# Patient Record
Sex: Female | Born: 1935 | Race: Black or African American | Hispanic: No | Smoking: Never smoker
Health system: Southern US, Community
[De-identification: ages and names within clinical notes are randomized; demographics above are authoritative.]

## PROBLEM LIST (undated history)

## (undated) DIAGNOSIS — E079 Disorder of thyroid, unspecified: Secondary | ICD-10-CM

## (undated) DIAGNOSIS — G459 Transient cerebral ischemic attack, unspecified: Secondary | ICD-10-CM

## (undated) DIAGNOSIS — E119 Type 2 diabetes mellitus without complications: Secondary | ICD-10-CM

## (undated) DIAGNOSIS — I639 Cerebral infarction, unspecified: Secondary | ICD-10-CM

## (undated) DIAGNOSIS — I10 Essential (primary) hypertension: Secondary | ICD-10-CM

## (undated) DIAGNOSIS — M1990 Unspecified osteoarthritis, unspecified site: Secondary | ICD-10-CM

## (undated) DIAGNOSIS — I509 Heart failure, unspecified: Secondary | ICD-10-CM

---

## 2017-04-15 ENCOUNTER — Other Ambulatory Visit: Payer: Self-pay

## 2017-04-15 ENCOUNTER — Emergency Department (HOSPITAL_BASED_OUTPATIENT_CLINIC_OR_DEPARTMENT_OTHER)
Admission: EM | Admit: 2017-04-15 | Discharge: 2017-04-15 | Disposition: A | Discharge status: Home / Self Care | Payer: Medicare Other | Attending: Emergency Medicine | Admitting: Emergency Medicine

## 2017-04-15 ENCOUNTER — Encounter (HOSPITAL_BASED_OUTPATIENT_CLINIC_OR_DEPARTMENT_OTHER): Payer: Self-pay | Admitting: *Deleted

## 2017-04-15 ENCOUNTER — Emergency Department (HOSPITAL_BASED_OUTPATIENT_CLINIC_OR_DEPARTMENT_OTHER): Payer: Medicare Other

## 2017-04-15 DIAGNOSIS — Z7982 Long term (current) use of aspirin: Secondary | ICD-10-CM | POA: Insufficient documentation

## 2017-04-15 DIAGNOSIS — Z79899 Other long term (current) drug therapy: Secondary | ICD-10-CM | POA: Diagnosis not present

## 2017-04-15 DIAGNOSIS — Y999 Unspecified external cause status: Secondary | ICD-10-CM | POA: Insufficient documentation

## 2017-04-15 DIAGNOSIS — R3912 Poor urinary stream: Secondary | ICD-10-CM | POA: Diagnosis not present

## 2017-04-15 DIAGNOSIS — Z7902 Long term (current) use of antithrombotics/antiplatelets: Secondary | ICD-10-CM | POA: Diagnosis not present

## 2017-04-15 DIAGNOSIS — Y929 Unspecified place or not applicable: Secondary | ICD-10-CM | POA: Insufficient documentation

## 2017-04-15 DIAGNOSIS — Y939 Activity, unspecified: Secondary | ICD-10-CM | POA: Diagnosis not present

## 2017-04-15 DIAGNOSIS — I509 Heart failure, unspecified: Secondary | ICD-10-CM | POA: Insufficient documentation

## 2017-04-15 DIAGNOSIS — I110 Hypertensive heart disease with heart failure: Secondary | ICD-10-CM | POA: Diagnosis not present

## 2017-04-15 DIAGNOSIS — W19XXXA Unspecified fall, initial encounter: Secondary | ICD-10-CM | POA: Insufficient documentation

## 2017-04-15 DIAGNOSIS — E119 Type 2 diabetes mellitus without complications: Secondary | ICD-10-CM | POA: Diagnosis not present

## 2017-04-15 HISTORY — DX: Essential (primary) hypertension: I10

## 2017-04-15 HISTORY — DX: Type 2 diabetes mellitus without complications: E11.9

## 2017-04-15 HISTORY — DX: Unspecified osteoarthritis, unspecified site: M19.90

## 2017-04-15 HISTORY — DX: Disorder of thyroid, unspecified: E07.9

## 2017-04-15 HISTORY — DX: Heart failure, unspecified: I50.9

## 2017-04-15 LAB — CBC WITH DIFFERENTIAL/PLATELET
Basophils Absolute: 0 10*3/uL (ref 0.0–0.1)
Basophils Relative: 0 %
EOS ABS: 0.1 10*3/uL (ref 0.0–0.7)
EOS PCT: 1 %
HCT: 33.6 % — ABNORMAL LOW (ref 36.0–46.0)
Hemoglobin: 11.4 g/dL — ABNORMAL LOW (ref 12.0–15.0)
LYMPHS ABS: 1.5 10*3/uL (ref 0.7–4.0)
LYMPHS PCT: 35 %
MCH: 26.5 pg (ref 26.0–34.0)
MCHC: 33.9 g/dL (ref 30.0–36.0)
MCV: 78 fL (ref 78.0–100.0)
MONO ABS: 0.6 10*3/uL (ref 0.1–1.0)
MONOS PCT: 15 %
Neutro Abs: 2.1 10*3/uL (ref 1.7–7.7)
Neutrophils Relative %: 49 %
PLATELETS: 297 10*3/uL (ref 150–400)
RBC: 4.31 MIL/uL (ref 3.87–5.11)
RDW: 13.9 % (ref 11.5–15.5)
WBC: 4.3 10*3/uL (ref 4.0–10.5)

## 2017-04-15 LAB — COMPREHENSIVE METABOLIC PANEL
ALT: 146 U/L — ABNORMAL HIGH (ref 14–54)
ANION GAP: 11 (ref 5–15)
AST: 89 U/L — ABNORMAL HIGH (ref 15–41)
Albumin: 3.7 g/dL (ref 3.5–5.0)
Alkaline Phosphatase: 329 U/L — ABNORMAL HIGH (ref 38–126)
BUN: 6 mg/dL (ref 6–20)
CHLORIDE: 103 mmol/L (ref 101–111)
CO2: 23 mmol/L (ref 22–32)
Calcium: 9.3 mg/dL (ref 8.9–10.3)
Creatinine, Ser: 0.77 mg/dL (ref 0.44–1.00)
Glucose, Bld: 201 mg/dL — ABNORMAL HIGH (ref 65–99)
Potassium: 3.6 mmol/L (ref 3.5–5.1)
SODIUM: 137 mmol/L (ref 135–145)
Total Bilirubin: 0.6 mg/dL (ref 0.3–1.2)
Total Protein: 7.6 g/dL (ref 6.5–8.1)

## 2017-04-15 LAB — URINALYSIS, MICROSCOPIC (REFLEX)

## 2017-04-15 LAB — URINALYSIS, ROUTINE W REFLEX MICROSCOPIC
BILIRUBIN URINE: NEGATIVE
Glucose, UA: NEGATIVE mg/dL
Ketones, ur: NEGATIVE mg/dL
Leukocytes, UA: NEGATIVE
NITRITE: NEGATIVE
PROTEIN: NEGATIVE mg/dL
Specific Gravity, Urine: <1.005 — ABNORMAL LOW (ref 1.005–1.030)
pH: 7 (ref 5.0–8.0)

## 2017-04-15 LAB — CK: CK TOTAL: 143 U/L (ref 38–234)

## 2017-04-15 MED ORDER — SODIUM CHLORIDE 0.9 % IV BOLUS
1000.0000 mL | Freq: Once | INTRAVENOUS | Status: AC
  Administered 2017-04-15: 1000 mL via INTRAVENOUS

## 2017-04-15 NOTE — ED Notes (Signed)
Pt on monitor 

## 2017-04-15 NOTE — ED Notes (Signed)
2 unsuccessful IV attempt. 

## 2017-04-15 NOTE — ED Notes (Signed)
Patient is going home with family.  Great grand niece, Marcelino DusterMichelle, signed the discharge info.  Discharge instructions and info given to family.

## 2017-04-15 NOTE — Discharge Instructions (Signed)
Your evaluated in the emergency department for a fall.  We did some lab work to make sure there was no signs of any kidney injury from muscle breakdown.  From that standpoint we see no evidence of that.  Your lab work did show some elevation in your liver enzymes.  We offered you further workup for that and you and your family decided that he would take that up with your primary care doctor.  You should follow-up with your primary care doctor.  Please return if you have any problems.

## 2017-04-15 NOTE — ED Provider Notes (Signed)
[facility] EMERGENCY DEPARTMENT Provider Note   CSN: [id number] Arrival date & time: 04/15/17  1250     History   Chief Complaint Chief Complaint  Patient presents with  . Fall    HPI Debbie Ortiz is a 84 y.o. female.  The history is provided by the patient, a relative and medical records.  Fall  This is a new problem. The current episode started 12 to 24 hours ago. The problem occurs constantly. The problem has been resolved. Pertinent negatives include no chest pain, no abdominal pain, no headaches and no shortness of breath. Nothing aggravates the symptoms. Nothing relieves the symptoms. She has tried nothing for the symptoms. The treatment provided no relief.    Past Medical History:  Diagnosis Date  . Arthritis   . CHF (congestive heart failure) ([place])   . Diabetes mellitus without complication ([place])   . Hypertension   . Thyroid disease     There are no active problems to display for this patient.   History reviewed. No pertinent surgical history.   OB History   None      Home Medications    Prior to Admission medications   Medication Sig Start Date End Date Taking? Authorizing Provider  aspirin 81 MG chewable tablet Chew by mouth daily.   Yes [provider]  atorvastatin (LIPITOR) 20 MG tablet Take 20 mg by mouth daily.   Yes [provider]  clopidogrel (PLAVIX) 75 MG tablet Take 75 mg by mouth daily.   Yes [provider]  diazepam (VALIUM) 2 MG tablet Take 2 mg by mouth every 6 (six) hours as needed for anxiety.   Yes [provider]  furosemide (LASIX) 20 MG tablet Take 20 mg by mouth.   Yes [provider]  HYDROcodone-acetaminophen (NORCO/VICODIN) 5-325 MG tablet Take 1 tablet by mouth every 6 (six) hours as needed for moderate pain.   Yes [provider]  levothyroxine (SYNTHROID, LEVOTHROID) 100 MCG tablet Take 100 mcg by mouth daily before breakfast.   Yes [provider]    losartan (COZAAR) 25 MG tablet Take 25 mg by mouth daily.   Yes [provider]  metoprolol tartrate (LOPRESSOR) 25 MG tablet Take 25 mg by mouth 2 (two) times daily.   Yes [provider]  nitroGLYCERIN (NITROSTAT) 0.4 MG SL tablet Place 0.4 mg under the tongue every 5 (five) minutes as needed for chest pain.   Yes [provider]  polyethylene glycol (MIRALAX / GLYCOLAX) packet Take 17 g by mouth daily.   Yes [provider]    Family History History reviewed. No pertinent family history.  Social History Social History   Tobacco Use  . Smoking status: Never Smoker  . Smokeless tobacco: Never Used  Substance Use Topics  . Alcohol use: Not Currently  . Drug use: Never     Allergies   Codeine and Sulfa antibiotics   Review of Systems Review of Systems  Constitutional: Negative for chills, diaphoresis, fatigue and fever.  HENT: Negative for congestion.   Eyes: Negative for visual disturbance.  Respiratory: Negative for cough, chest tightness and shortness of breath.   Cardiovascular: Negative for chest pain and palpitations.  Gastrointestinal: Negative for abdominal pain, constipation, diarrhea and vomiting.  Genitourinary: Positive for decreased urine volume. Negative for dysuria, flank pain and frequency.  Musculoskeletal: Negative for back pain and neck pain.  Skin: Negative for rash and wound.  Neurological: Negative for light-headedness, numbness and headaches.  Psychiatric/Behavioral: Negative for agitation and confusion.  All other systems reviewed and are negative.    Physical Exam Updated Vital Signs BP (!) 182/90 (BP Location: Right Arm)   Pulse 70   Temp 98.4 F (36.9 C) (Oral)   Resp 18   Ht '5\' 5"'$  (1.651 m)   Wt 77.1 kg (170 lb)   SpO2 98%   BMI 28.29 kg/m   Physical Exam  Constitutional: She is oriented to person, place, and time. She appears well-developed and well-nourished. No distress.  HENT:  Head:  Normocephalic and atraumatic.  Nose: Nose normal.  Mouth/Throat: Oropharynx is clear and moist. No oropharyngeal exudate.  Eyes: Pupils are equal, round, and reactive to light. Conjunctivae and EOM are normal.  Neck: Normal range of motion. Neck supple. No JVD present.  Cardiovascular: Normal rate and intact distal pulses.  No murmur heard. Pulmonary/Chest: Effort normal. No stridor. No respiratory distress. She has no wheezes. She exhibits no tenderness.  Abdominal: Soft. Bowel sounds are normal. She exhibits no distension. There is no tenderness.  Musculoskeletal: Normal range of motion. She exhibits no edema or tenderness.  Neurological: She is alert and oriented to person, place, and time. No sensory deficit. She exhibits normal muscle tone.  Skin: Capillary refill takes less than 2 seconds. No rash noted. She is not diaphoretic. No erythema.  Psychiatric: She has a normal mood and affect.  Nursing note and vitals reviewed.    ED Treatments / Results  Labs (all labs ordered are listed, but only abnormal results are displayed) Labs Reviewed  URINALYSIS, ROUTINE W REFLEX MICROSCOPIC - Abnormal; Notable for the following components:      Result Value   Specific Gravity, Urine <1.005 (*)    Hgb urine dipstick TRACE (*)    All other components within normal limits  URINALYSIS, MICROSCOPIC (REFLEX) - Abnormal; Notable for the following components:   Bacteria, UA FEW (*)    Squamous Epithelial / LPF 0-5 (*)    All other components within normal limits  URINE CULTURE  CBC WITH DIFFERENTIAL/PLATELET  COMPREHENSIVE METABOLIC PANEL  CK    EKG EKG Interpretation  Date/Time:  Friday April 15 2017 14:02:12 EDT Ventricular Rate:  65 PR Interval:    QRS Duration: 86 QT Interval:  381 QTC Calculation: 397 R Axis:   -5 Text Interpretation:  Sinus rhythm Borderline repolarization abnormality Baseline wander in lead(s) I III aVR aVL No prior ECG for comparison.  T wave inversion in  leads 3,AVF, V5-V6.  No STEMI Confirmed by Antony Blackbird [phone]) on 04/15/2017 2:10:10 PM   Radiology Dg Chest 2 View  Result Date: 04/15/2017 CLINICAL DATA:  Status post fall. EXAM: CHEST - 2 VIEW COMPARISON:  None. FINDINGS: There is no focal consolidation. There is no pleural effusion or pneumothorax. The heart and mediastinal contours are unremarkable. The osseous structures are unremarkable. IMPRESSION: No active cardiopulmonary disease. Electronically Signed   By: Kathreen Devoid   On: 04/15/2017 13:59    Procedures Procedures (including critical care time)  Medications Ordered in ED Medications  sodium chloride 0.9 % bolus 1,000 mL (1,000 mLs Intravenous New Bag/Given 04/15/17 1618)     Initial Impression / Assessment and Plan / ED Course  I have reviewed the triage vital signs and the nursing notes.  Pertinent labs & imaging results that were available during my care of the patient were reviewed by me and considered in my medical decision making (see chart for details).  Clinical Course as of Apr 16 971  Fri Apr 15, 2017  1700 Patient's lab work is unremarkable for any evidence of renal dysfunction or elevated CPK.  I do not see any old labs on her and her AST and ALT and alk phos are elevated with a normal T bili.  She does not seem to have any complaints to this of asked her to follow-up with her primary care doctor regarding this.   [MB]    Clinical Course User Index [MB] Hayden Rasmussen, MD    Meriem Lemieux is a 84 y.o. female with a past medical history significant for hypertension, diabetes, CHF, and thyroid disease who presents with decreased urination for the last few days and then a fall with prolonged period of downtime overnight.  Patient reports that for the last few days she has had decreased urination.  She denies dysuria but reports it is darker.  She denies abdominal pain, chest pain or palpitations.  She denies recent fevers or chills.  She denies nausea,  vomiting, headache, or neck pain.  She reports that last night she had an episode where she had chronic right knee pains that caused her to slide to the ground.  She reports that she did not fall or hit her head and is not having any traumatic injuries.  She denies any pain in any location at this time but reports that due to her knee injury she cannot get up on her own.  She reports that she laid on the ground for approximately 10 hours prior to help coming this morning.  She reports that she was able to pull a blanket and pillow to the ground.  Patient was sent for evaluation to rule out rhabdo in the setting of prolonged downtime and decreased urination.  On exam, lungs are clear.  Chest is nontender.  Back nontender.  No bruises or traumatic injury evidence is seen.  Patient had no focal neurologic deficits.  Patient alert and oriented.  Patient's abdomen is nontender.  No CVA tenderness.  Patient has tenderness in her right knee which she reports is chronic.  Normal pulses in all extremity's.  Normal sensation throughout.  Patient appears well.    Due to the prolonged downtime and patient will screen laboratory testing including CK.  Patient will have urinalysis and urine culture given the urine changes over the last few days.  Patient will chest x-ray as well given the downtime and report of fall.    If workup is reassuring and patient continues to have reassuring vital signs, patient is likely stable for discharge home however, if patient has any abnormality seen, will have to consider rehydration or admission.     Urinalysis shows no evidence of infection with negative nitrites and leukocytes.  Ultrasound IV was placed by me for IV access.  Chest x-ray shows no pneumonia.    Care transferred to Dr. Melina Copa.  If labs are reassuring, patient will likely be stable for discharge home.  Patient was given fluids to rehydrate her during workup.   Final Clinical Impressions(s) / ED Diagnoses   Final  diagnoses:  Fall, initial encounter    Clinical Impression: 1. Fall, initial encounter     Disposition: Care transferred to Dr. Talmage Coin, Gwenyth Allegra, MD 04/15/17 [phone]

## 2017-04-15 NOTE — ED Triage Notes (Signed)
Pt just moved to [facility].Pt got upon the middle of the night, tripped on her walker and slid herself to the floor. Did not hit head.  Home health found her this morning after being on the floor for 10 hours. No abnormalities. Urine has been noted to be dark in color. [facility] VS are as follows: BP:168/86 HR: 80 Resp: 18 CBG: 220

## 2017-04-15 NOTE — ED Provider Notes (Signed)
Sign-out note from Dr. Sherry Ruffing.  Patient had a fall last night and stayed on the floor until morning.  She is here for an evaluation of that.  Plan is to evaluate the labs for any signs of rhabdo with worsening creatinine or elevated CPK.  If these are normal she can be discharged home. Clinical Course as of Apr 16 1744  Fri Apr 15, 2017  1700 Patient's lab work is unremarkable for any evidence of renal dysfunction or elevated CPK.  I do not see any old labs on her and her AST and ALT and alk phos are elevated with a normal T bili.  She does not seem to have any complaints to this of asked her to follow-up with her primary care doctor regarding this.   [MB]    Clinical Course User Index [MB] Hayden Rasmussen, MD      Hayden Rasmussen, MD 04/16/17 [phone]

## 2017-04-16 LAB — URINE CULTURE: Culture: NO GROWTH

## 2017-05-13 ENCOUNTER — Emergency Department (HOSPITAL_COMMUNITY)
Admission: EM | Admit: 2017-05-13 | Discharge: 2017-05-13 | Disposition: A | Discharge status: Home / Self Care | Payer: Medicare Other | Attending: Emergency Medicine | Admitting: Emergency Medicine

## 2017-05-13 ENCOUNTER — Other Ambulatory Visit: Payer: Self-pay

## 2017-05-13 ENCOUNTER — Encounter (HOSPITAL_COMMUNITY): Payer: Self-pay

## 2017-05-13 ENCOUNTER — Emergency Department (HOSPITAL_COMMUNITY): Payer: Medicare Other

## 2017-05-13 DIAGNOSIS — Z7902 Long term (current) use of antithrombotics/antiplatelets: Secondary | ICD-10-CM | POA: Diagnosis not present

## 2017-05-13 DIAGNOSIS — I509 Heart failure, unspecified: Secondary | ICD-10-CM | POA: Diagnosis not present

## 2017-05-13 DIAGNOSIS — M79641 Pain in right hand: Secondary | ICD-10-CM | POA: Diagnosis not present

## 2017-05-13 DIAGNOSIS — Z7982 Long term (current) use of aspirin: Secondary | ICD-10-CM | POA: Diagnosis not present

## 2017-05-13 DIAGNOSIS — Y9389 Activity, other specified: Secondary | ICD-10-CM | POA: Insufficient documentation

## 2017-05-13 DIAGNOSIS — I110 Hypertensive heart disease with heart failure: Secondary | ICD-10-CM | POA: Insufficient documentation

## 2017-05-13 DIAGNOSIS — Y999 Unspecified external cause status: Secondary | ICD-10-CM | POA: Diagnosis not present

## 2017-05-13 DIAGNOSIS — R5383 Other fatigue: Secondary | ICD-10-CM | POA: Diagnosis not present

## 2017-05-13 DIAGNOSIS — W010XXA Fall on same level from slipping, tripping and stumbling without subsequent striking against object, initial encounter: Secondary | ICD-10-CM | POA: Diagnosis not present

## 2017-05-13 DIAGNOSIS — Y92009 Unspecified place in unspecified non-institutional (private) residence as the place of occurrence of the external cause: Secondary | ICD-10-CM | POA: Insufficient documentation

## 2017-05-13 DIAGNOSIS — E119 Type 2 diabetes mellitus without complications: Secondary | ICD-10-CM | POA: Diagnosis not present

## 2017-05-13 DIAGNOSIS — W19XXXA Unspecified fall, initial encounter: Secondary | ICD-10-CM

## 2017-05-13 DIAGNOSIS — M542 Cervicalgia: Secondary | ICD-10-CM | POA: Insufficient documentation

## 2017-05-13 DIAGNOSIS — Z79899 Other long term (current) drug therapy: Secondary | ICD-10-CM | POA: Diagnosis not present

## 2017-05-13 DIAGNOSIS — R51 Headache: Secondary | ICD-10-CM | POA: Insufficient documentation

## 2017-05-13 LAB — CBC WITH DIFFERENTIAL/PLATELET
BASOS ABS: 0 10*3/uL (ref 0.0–0.1)
Basophils Relative: 0 %
Eosinophils Absolute: 0 10*3/uL (ref 0.0–0.7)
Eosinophils Relative: 0 %
HCT: 33.8 % — ABNORMAL LOW (ref 36.0–46.0)
HEMOGLOBIN: 11.6 g/dL — AB (ref 12.0–15.0)
LYMPHS PCT: 23 %
Lymphs Abs: 1.7 10*3/uL (ref 0.7–4.0)
MCH: 26.6 pg (ref 26.0–34.0)
MCHC: 34.3 g/dL (ref 30.0–36.0)
MCV: 77.5 fL — AB (ref 78.0–100.0)
Monocytes Absolute: 1.1 10*3/uL — ABNORMAL HIGH (ref 0.1–1.0)
Monocytes Relative: 16 %
NEUTROS PCT: 61 %
Neutro Abs: 4.4 10*3/uL (ref 1.7–7.7)
Platelets: 384 10*3/uL (ref 150–400)
RBC: 4.36 MIL/uL (ref 3.87–5.11)
RDW: 14.2 % (ref 11.5–15.5)
WBC: 7.3 10*3/uL (ref 4.0–10.5)

## 2017-05-13 LAB — COMPREHENSIVE METABOLIC PANEL
ALK PHOS: 306 U/L — AB (ref 38–126)
ALT: 25 U/L (ref 14–54)
AST: 37 U/L (ref 15–41)
Albumin: 3.4 g/dL — ABNORMAL LOW (ref 3.5–5.0)
Anion gap: 16 — ABNORMAL HIGH (ref 5–15)
BUN: 16 mg/dL (ref 6–20)
CALCIUM: 9.7 mg/dL (ref 8.9–10.3)
CO2: 19 mmol/L — ABNORMAL LOW (ref 22–32)
CREATININE: 1 mg/dL (ref 0.44–1.00)
Chloride: 93 mmol/L — ABNORMAL LOW (ref 101–111)
GFR calc non Af Amer: 47 mL/min — ABNORMAL LOW (ref >60)
GFR, EST AFRICAN AMERICAN: 55 mL/min — AB (ref >60)
Glucose, Bld: 59 mg/dL — ABNORMAL LOW (ref 65–99)
Potassium: 4.5 mmol/L (ref 3.5–5.1)
SODIUM: 128 mmol/L — AB (ref 135–145)
Total Bilirubin: 0.6 mg/dL (ref 0.3–1.2)
Total Protein: 8 g/dL (ref 6.5–8.1)

## 2017-05-13 LAB — URINALYSIS, ROUTINE W REFLEX MICROSCOPIC
BILIRUBIN URINE: NEGATIVE
GLUCOSE, UA: NEGATIVE mg/dL
HGB URINE DIPSTICK: NEGATIVE
KETONES UR: NEGATIVE mg/dL
Leukocytes, UA: NEGATIVE
NITRITE: NEGATIVE
Protein, ur: NEGATIVE mg/dL
SPECIFIC GRAVITY, URINE: 1.004 — AB (ref 1.005–1.030)
pH: 7 (ref 5.0–8.0)

## 2017-05-13 LAB — CBG MONITORING, ED: Glucose-Capillary: 66 mg/dL (ref 65–99)

## 2017-05-13 NOTE — ED Triage Notes (Signed)
[facility] r/t fall when attempted to get out of bed. Pt called EMS, denies LOC, denies hitting head states her right hand and right eyelid are sore. 146/64 BP, 60 HR, 100% RA, 74 CBG, 16 RR. EMS reports pt is on blood thinners. Denies neck and back pain.

## 2017-05-13 NOTE — ED Notes (Signed)
Pt provided sandwich, applesauce and beverage while waiting for [facility]

## 2017-05-13 NOTE — Discharge Instructions (Signed)
Your imaging today did not show evidence of injuries from her fall.  We did not find evidence of acute infection causing your fatigue.  Please stay hydrated and follow-up with your primary doctor in the next few days.  If any symptoms change or worsen, please return to the nearest emergency department.

## 2017-05-13 NOTE — ED Provider Notes (Signed)
[facility] EMERGENCY DEPARTMENT Provider Note   CSN: [id number] Arrival date & time: 05/13/17  1501     History   Chief Complaint Chief Complaint  Patient presents with  . Fall    HPI Debbie Ortiz is a 84 y.o. female.  The history is provided by the patient, a relative and medical records.  Fall  This is a recurrent problem. The current episode started 3 to 5 hours ago. The problem occurs constantly. The problem has been resolved. Associated symptoms include headaches. Pertinent negatives include no chest pain and no shortness of breath. Nothing aggravates the symptoms. Nothing relieves the symptoms. She has tried nothing for the symptoms. The treatment provided no relief.    Past Medical History:  Diagnosis Date  . Arthritis   . CHF (congestive heart failure) (HCC)   . Diabetes mellitus without complication (HCC)   . Hypertension   . Thyroid disease     There are no active problems to display for this patient.   History reviewed. No pertinent surgical history.   OB History   None      Home Medications    Prior to Admission medications   Medication Sig Start Date End Date Taking? Authorizing Provider  aspirin 81 MG chewable tablet Chew by mouth daily.    [provider]  atorvastatin (LIPITOR) 20 MG tablet Take 20 mg by mouth daily.    [provider]  clopidogrel (PLAVIX) 75 MG tablet Take 75 mg by mouth daily.    [provider]  diazepam (VALIUM) 2 MG tablet Take 2 mg by mouth every 6 (six) hours as needed for anxiety.    [provider]  furosemide (LASIX) 20 MG tablet Take 20 mg by mouth.    [provider]  HYDROcodone-acetaminophen (NORCO/VICODIN) 5-325 MG tablet Take 1 tablet by mouth every 6 (six) hours as needed for moderate pain.    [provider]  levothyroxine (SYNTHROID, LEVOTHROID) 100 MCG tablet Take 100 mcg by mouth daily before breakfast.    [provider]    losartan (COZAAR) 25 MG tablet Take 25 mg by mouth daily.    [provider]  metoprolol tartrate (LOPRESSOR) 25 MG tablet Take 25 mg by mouth 2 (two) times daily.    [provider]  nitroGLYCERIN (NITROSTAT) 0.4 MG SL tablet Place 0.4 mg under the tongue every 5 (five) minutes as needed for chest pain.    [provider]  polyethylene glycol (MIRALAX / GLYCOLAX) packet Take 17 g by mouth daily.    [provider]    Family History No family history on file.  Social History Social History   Tobacco Use  . Smoking status: Never Smoker  . Smokeless tobacco: Never Used  Substance Use Topics  . Alcohol use: Not Currently  . Drug use: Never     Allergies   Codeine and Sulfa antibiotics   Review of Systems Review of Systems  Constitutional: Positive for fatigue. Negative for chills, diaphoresis and fever.  HENT: Negative for congestion and rhinorrhea.   Eyes: Negative for visual disturbance.  Respiratory: Negative for cough, chest tightness, shortness of breath and wheezing.   Cardiovascular: Negative for chest pain, palpitations and leg swelling.  Gastrointestinal: Negative for constipation, diarrhea, nausea and vomiting.  Genitourinary: Negative for dysuria.  Musculoskeletal: Positive for neck pain. Negative for back pain and neck stiffness.  Skin: Negative for rash and wound.  Neurological: Positive for headaches. Negative for dizziness and  light-headedness.  Psychiatric/Behavioral: Negative for agitation and confusion.  All other systems reviewed and are negative.    Physical Exam Updated Vital Signs BP (!) 155/65 (BP Location: Right Arm)   Pulse (!) 58   Temp 97.7 F (36.5 C) (Oral)   Resp 16   Ht 5\' 5"  (1.651 m)   Wt 77.1 kg (170 lb)   SpO2 98%   BMI 28.29 kg/m   Physical Exam  Constitutional: She appears well-developed and well-nourished. No distress.  HENT:  Head: Normocephalic and atraumatic. Head is without  laceration.    Mouth/Throat: Oropharynx is clear and moist. No oropharyngeal exudate.  Eyes: Conjunctivae are normal.  Neck: Neck supple.  Cardiovascular: Normal rate and regular rhythm.  No murmur heard. Pulmonary/Chest: Effort normal and breath sounds normal. No respiratory distress. She has no wheezes. She exhibits no tenderness.  Abdominal: Soft. There is no tenderness.  Musculoskeletal: She exhibits no edema.       Right hand: She exhibits tenderness. She exhibits normal capillary refill, no deformity, no laceration and no swelling. Normal sensation noted. Normal strength noted.       Hands: Neurological: She is alert.  Skin: Skin is warm and dry. No rash noted. She is not diaphoretic. No erythema.  Psychiatric: She has a normal mood and affect.  Nursing note and vitals reviewed.    ED Treatments / Results  Labs (all labs ordered are listed, but only abnormal results are displayed) Labs Reviewed  CBC WITH DIFFERENTIAL/PLATELET - Abnormal; Notable for the following components:      Result Value   Hemoglobin 11.6 (*)    HCT 33.8 (*)    MCV 77.5 (*)    Monocytes Absolute 1.1 (*)    All other components within normal limits  COMPREHENSIVE METABOLIC PANEL - Abnormal; Notable for the following components:   Sodium 128 (*)    Chloride 93 (*)    CO2 19 (*)    Glucose, Bld 59 (*)    Albumin 3.4 (*)    Alkaline Phosphatase 306 (*)    GFR calc non Af Amer 47 (*)    GFR calc Af Amer 55 (*)    Anion gap 16 (*)    All other components within normal limits  URINALYSIS, ROUTINE W REFLEX MICROSCOPIC - Abnormal; Notable for the following components:   Color, Urine STRAW (*)    Specific Gravity, Urine 1.004 (*)    All other components within normal limits  URINE CULTURE  CBG MONITORING, ED    EKG None  Radiology Dg Chest 2 View  Result Date: 05/13/2017 CLINICAL DATA:  Larey Seat trying to get out of bed, fatigue, weakness EXAM: CHEST - 2 VIEW COMPARISON:  Chest x-ray of 04/15/2017  FINDINGS: No active infiltrate or effusion is seen. Prominent soft tissue in the right paratracheal region probably represents ectatic great vessels but attention to this area on followup chest x-ray is recommended. Cardiomegaly is stable. No acute bony abnormality is seen. There is mild degenerative change in both shoulders. IMPRESSION: 1. No active lung disease. 2. Prominent soft tissue in the right superior paratracheal region most likely ectatic great vessels but recommend attention to this area on followup chest x-ray. 3. Stable cardiomegaly. Electronically Signed   By: Dwyane Dee M.D.   On: 05/13/2017 16:15   Dg Wrist Complete Right  Result Date: 05/13/2017 CLINICAL DATA:  Fall. EXAM: RIGHT HAND - COMPLETE 3+ VIEW; RIGHT WRIST - COMPLETE 3+ VIEW COMPARISON:  None. FINDINGS: No acute fracture or  dislocation. 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 Diffuse osteopenia. Chondrocalcinosis of the TFCC. Soft tissues are unremarkable. IMPRESSION: 1.  No acute osseous abnormality. Electronically Signed   By: Obie DredgeWilliam T Derry M.D.   On: 05/13/2017 16:18   Ct Head Wo Contrast  Result Date: 05/13/2017 CLINICAL DATA:  Larey SeatFell getting out of bed today. EXAM: CT HEAD WITHOUT CONTRAST CT CERVICAL SPINE WITHOUT CONTRAST TECHNIQUE: Multidetector CT imaging of the head and cervical spine was performed following the standard protocol without intravenous contrast. Multiplanar CT image reconstructions of the cervical spine were also generated. COMPARISON:  None. FINDINGS: CT HEAD FINDINGS Brain: No evidence of acute infarction, hemorrhage, hydrocephalus, extra-axial collection or mass lesion/mass effect. Mild periventricular white matter hypoattenuation is noted consistent with chronic microvascular ischemic change. Vascular: No hyperdense vessel or unexpected calcification. Skull: Normal. Negative for fracture or focal lesion. Sinuses/Orbits: Globes and orbits are unremarkable. Sinuses  and mastoid air cells are clear. Other: None. CT CERVICAL SPINE FINDINGS Alignment: Normal. Skull base and vertebrae: No acute fracture. No primary bone lesion or focal pathologic process. Soft tissues and spinal canal: No prevertebral fluid or swelling. No visible canal hematoma. Disc levels: There are degenerative changes throughout the cervical spine. There is marked loss of disc height with endplate sclerosis and osteophytes at C3-C4 moderate loss of disc height and endplate spurring at C5-C6. Facet degenerative changes are noted bilaterally greatest on the left at C4-C5. No convincing disc herniation. Upper chest: No acute findings.  Minor scarring at the lung apices. Other: None. IMPRESSION: HEAD CT 1. No acute intracranial abnormalities. 2. Mild chronic microvascular ischemic change. CERVICAL CT 1. No fracture or acute finding. Electronically Signed   By: Amie Portlandavid  Ormond M.D.   On: 05/13/2017 15:48   Ct Cervical Spine Wo Contrast  Result Date: 05/13/2017 CLINICAL DATA:  Larey SeatFell getting out of bed today. EXAM: CT HEAD WITHOUT CONTRAST CT CERVICAL SPINE WITHOUT CONTRAST TECHNIQUE: Multidetector CT imaging of the head and cervical spine was performed following the standard protocol without intravenous contrast. Multiplanar CT image reconstructions of the cervical spine were also generated. COMPARISON:  None. FINDINGS: CT HEAD FINDINGS Brain: No evidence of acute infarction, hemorrhage, hydrocephalus, extra-axial collection or mass lesion/mass effect. Mild periventricular white matter hypoattenuation is noted consistent with chronic microvascular ischemic change. Vascular: No hyperdense vessel or unexpected calcification. Skull: Normal. Negative for fracture or focal lesion. Sinuses/Orbits: Globes and orbits are unremarkable. Sinuses and mastoid air cells are clear. Other: None. CT CERVICAL SPINE FINDINGS Alignment: Normal. Skull base and vertebrae: No acute fracture. No primary bone lesion or focal pathologic  process. Soft tissues and spinal canal: No prevertebral fluid or swelling. No visible canal hematoma. Disc levels: There are degenerative changes throughout the cervical spine. There is marked loss of disc height with endplate sclerosis and osteophytes at C3-C4 moderate loss of disc height and endplate spurring at C5-C6. Facet degenerative changes are noted bilaterally greatest on the left at C4-C5. No convincing disc herniation. Upper chest: No acute findings.  Minor scarring at the lung apices. Other: None. IMPRESSION: HEAD CT 1. No acute intracranial abnormalities. 2. Mild chronic microvascular ischemic change. CERVICAL CT 1. No fracture or acute finding. Electronically Signed   By: Amie Portlandavid  Ormond M.D.   On: 05/13/2017 15:48   Dg Hand Complete Right  Result Date: 05/13/2017 CLINICAL DATA:  Fall. EXAM: RIGHT HAND - COMPLETE 3+ VIEW; RIGHT WRIST - COMPLETE 3+ VIEW COMPARISON:  None. FINDINGS: No acute fracture or dislocation. Mild osteoarthritis of  the first [facility] joint and scaphotrapeziotrapezoid joint. Diffuse osteopenia. Chondrocalcinosis of the TFCC. Soft tissues are unremarkable. IMPRESSION: 1.  No acute osseous abnormality. Electronically Signed   By: Obie Dredge M.D.   On: 05/13/2017 16:18    Procedures Procedures (including critical care time)  Medications Ordered in ED Medications - No data to display   Initial Impression / Assessment and Plan / ED Course  I have reviewed the triage vital signs and the nursing notes.  Pertinent labs & imaging results that were available during my care of the patient were reviewed by me and considered in my medical decision making (see chart for details).     Debbie Ortiz is a 84 y.o. female with a past medical history significant for hypertension, diabetes, thyroid disease, CHF, arthritis, and multiple falls who presents with a fall.  Patient reports that she was trying to get out of bed and walk to the bathroom when she tripped.  She reports having  pain in her right hand, her right head, and her neck.  She does report that she has had some fatigue recently but denies any urinary symptoms, GI symptoms, chest pain, shortness breath, or cough.  She denied loss of consciousness.  She reports her headache is mild to moderate on the right side of her head and upper face.  She denies any vision changes.    On exam, patient had normal sensation and strength in all extremities.  Patient had mild tenderness on her right upper face.  Patient had normal extractor movements.  Pupils are symmetric and reactive bilaterally.  Mild cloudiness in the left cornea.  Patient's oropharyngeal exam unremarkable.  Patient had subtle tenderness in her bilateral paraspinal neck.  No significant back tenderness lungs clear.  Chest nontender.  Patient has chronic right knee pain which she reports is unchanged and it was nontender on my exam.  Tenderness in the right dorsal hand but no tenderness in the right snuffbox.  Patient will have imaging of her hand as well as head and neck.  Due to the reported fatigue, patient will have work-up to look for occult infection or letter abnormality may contribute to her fall.  Anticipate reassessment after imaging and work-up.  7:30 PM Diagnostic testing did not show evidence of acute fractures.  We did not find evidence of new bacterial infection.  Patient did have some electrolyte abnormality with hyponatremia.  Patient encouraged to stay hydrated at home.  Patient was feeling much better and her cervical towel was removed.  Patient will follow up with PCP in several days.  Patient and family had no other questions or concerns and patient was discharged in good condition.    Final Clinical Impressions(s) / ED Diagnoses   Final diagnoses:  Fall, initial encounter    ED Discharge Orders    None      Clinical Impression: 1. Fall, initial encounter     Disposition: Discharge  Condition: Good  I have discussed the  results, Dx and Tx plan with the pt(& family if present). He/she/they expressed understanding and agree(s) with the plan. Discharge instructions discussed at great length. Strict return precautions discussed and pt &/or family have verbalized understanding of the instructions. No further questions at time of discharge.    New Prescriptions   No medications on file    Follow Up: [facility] EMERGENCY DEPARTMENT [address] [id number] mc [place] [address] [phone]    your primary doctor  Tegeler, Canary Brim, MD 05/14/17 [phone]

## 2017-05-13 NOTE — ED Notes (Signed)
[facility] contacted for tx back home 

## 2017-05-13 NOTE — ED Notes (Signed)
ED Provider at bedside. 

## 2017-05-13 NOTE — ED Notes (Signed)
ED provider at bedside.

## 2017-05-13 NOTE — ED Notes (Signed)
E-signature not available. Patient/caregiver verbalizes understanding of discharge instructions.

## 2017-05-13 NOTE — ED Notes (Signed)
Patient transported to CT 

## 2017-05-14 LAB — URINE CULTURE: Culture: <10000 — AB

## 2017-05-15 ENCOUNTER — Other Ambulatory Visit: Payer: Self-pay

## 2017-05-15 ENCOUNTER — Observation Stay (HOSPITAL_COMMUNITY): Payer: Medicare Other

## 2017-05-15 ENCOUNTER — Encounter (HOSPITAL_BASED_OUTPATIENT_CLINIC_OR_DEPARTMENT_OTHER): Payer: Self-pay | Admitting: *Deleted

## 2017-05-15 ENCOUNTER — Emergency Department (HOSPITAL_BASED_OUTPATIENT_CLINIC_OR_DEPARTMENT_OTHER): Payer: Medicare Other

## 2017-05-15 ENCOUNTER — Inpatient Hospital Stay (HOSPITAL_BASED_OUTPATIENT_CLINIC_OR_DEPARTMENT_OTHER)
Admission: EM | Admit: 2017-05-15 | Discharge: 2017-05-20 | DRG: 069 | Disposition: A | Discharge status: Skilled Nursing Facility | Payer: Medicare Other | Attending: Internal Medicine | Admitting: Internal Medicine

## 2017-05-15 DIAGNOSIS — I10 Essential (primary) hypertension: Secondary | ICD-10-CM | POA: Diagnosis present

## 2017-05-15 DIAGNOSIS — E119 Type 2 diabetes mellitus without complications: Secondary | ICD-10-CM

## 2017-05-15 DIAGNOSIS — G8194 Hemiplegia, unspecified affecting left nondominant side: Secondary | ICD-10-CM | POA: Diagnosis present

## 2017-05-15 DIAGNOSIS — I471 Supraventricular tachycardia: Secondary | ICD-10-CM | POA: Diagnosis not present

## 2017-05-15 DIAGNOSIS — R7989 Other specified abnormal findings of blood chemistry: Secondary | ICD-10-CM

## 2017-05-15 DIAGNOSIS — F0390 Unspecified dementia without behavioral disturbance: Secondary | ICD-10-CM | POA: Diagnosis present

## 2017-05-15 DIAGNOSIS — Z7902 Long term (current) use of antithrombotics/antiplatelets: Secondary | ICD-10-CM

## 2017-05-15 DIAGNOSIS — G459 Transient cerebral ischemic attack, unspecified: Secondary | ICD-10-CM | POA: Diagnosis present

## 2017-05-15 DIAGNOSIS — R29707 NIHSS score 7: Secondary | ICD-10-CM | POA: Diagnosis present

## 2017-05-15 DIAGNOSIS — E871 Hypo-osmolality and hyponatremia: Secondary | ICD-10-CM | POA: Diagnosis present

## 2017-05-15 DIAGNOSIS — Z6829 Body mass index (BMI) 29.0-29.9, adult: Secondary | ICD-10-CM

## 2017-05-15 DIAGNOSIS — Z882 Allergy status to sulfonamides status: Secondary | ICD-10-CM

## 2017-05-15 DIAGNOSIS — I110 Hypertensive heart disease with heart failure: Secondary | ICD-10-CM | POA: Diagnosis not present

## 2017-05-15 DIAGNOSIS — E11649 Type 2 diabetes mellitus with hypoglycemia without coma: Secondary | ICD-10-CM | POA: Diagnosis not present

## 2017-05-15 DIAGNOSIS — E039 Hypothyroidism, unspecified: Secondary | ICD-10-CM | POA: Diagnosis present

## 2017-05-15 DIAGNOSIS — R402142 Coma scale, eyes open, spontaneous, at arrival to emergency department: Secondary | ICD-10-CM | POA: Diagnosis present

## 2017-05-15 DIAGNOSIS — Z7982 Long term (current) use of aspirin: Secondary | ICD-10-CM

## 2017-05-15 DIAGNOSIS — I5030 Unspecified diastolic (congestive) heart failure: Secondary | ICD-10-CM | POA: Diagnosis not present

## 2017-05-15 DIAGNOSIS — Z66 Do not resuscitate: Secondary | ICD-10-CM

## 2017-05-15 DIAGNOSIS — Z79890 Hormone replacement therapy (postmenopausal): Secondary | ICD-10-CM | POA: Diagnosis not present

## 2017-05-15 DIAGNOSIS — Z8679 Personal history of other diseases of the circulatory system: Secondary | ICD-10-CM

## 2017-05-15 DIAGNOSIS — R4189 Other symptoms and signs involving cognitive functions and awareness: Secondary | ICD-10-CM | POA: Diagnosis not present

## 2017-05-15 DIAGNOSIS — R4781 Slurred speech: Secondary | ICD-10-CM | POA: Diagnosis not present

## 2017-05-15 DIAGNOSIS — R627 Adult failure to thrive: Secondary | ICD-10-CM | POA: Diagnosis not present

## 2017-05-15 DIAGNOSIS — Z885 Allergy status to narcotic agent status: Secondary | ICD-10-CM | POA: Diagnosis not present

## 2017-05-15 DIAGNOSIS — M1990 Unspecified osteoarthritis, unspecified site: Secondary | ICD-10-CM | POA: Diagnosis not present

## 2017-05-15 DIAGNOSIS — R402362 Coma scale, best motor response, obeys commands, at arrival to emergency department: Secondary | ICD-10-CM | POA: Diagnosis present

## 2017-05-15 DIAGNOSIS — R402252 Coma scale, best verbal response, oriented, at arrival to emergency department: Secondary | ICD-10-CM | POA: Diagnosis present

## 2017-05-15 DIAGNOSIS — Z87898 Personal history of other specified conditions: Secondary | ICD-10-CM

## 2017-05-15 DIAGNOSIS — R531 Weakness: Secondary | ICD-10-CM

## 2017-05-15 DIAGNOSIS — R778 Other specified abnormalities of plasma proteins: Secondary | ICD-10-CM | POA: Diagnosis present

## 2017-05-15 DIAGNOSIS — Z515 Encounter for palliative care: Secondary | ICD-10-CM

## 2017-05-15 LAB — CBC WITH DIFFERENTIAL/PLATELET
BASOS ABS: 0 10*3/uL (ref 0.0–0.1)
BASOS PCT: 0 %
EOS ABS: 0.1 10*3/uL (ref 0.0–0.7)
Eosinophils Relative: 1 %
HCT: 34.4 % — ABNORMAL LOW (ref 36.0–46.0)
Hemoglobin: 11.9 g/dL — ABNORMAL LOW (ref 12.0–15.0)
Lymphocytes Relative: 35 %
Lymphs Abs: 2 10*3/uL (ref 0.7–4.0)
MCH: 26.6 pg (ref 26.0–34.0)
MCHC: 34.6 g/dL (ref 30.0–36.0)
MCV: 77 fL — ABNORMAL LOW (ref 78.0–100.0)
Monocytes Absolute: 1.1 10*3/uL — ABNORMAL HIGH (ref 0.1–1.0)
Monocytes Relative: 20 %
Neutro Abs: 2.5 10*3/uL (ref 1.7–7.7)
Neutrophils Relative %: 44 %
Platelets: 392 10*3/uL (ref 150–400)
RBC: 4.47 MIL/uL (ref 3.87–5.11)
RDW: 14.3 % (ref 11.5–15.5)
WBC: 5.7 10*3/uL (ref 4.0–10.5)

## 2017-05-15 LAB — RAPID URINE DRUG SCREEN, HOSP PERFORMED
Amphetamines: NOT DETECTED
BARBITURATES: NOT DETECTED
Benzodiazepines: POSITIVE — AB
COCAINE: NOT DETECTED
OPIATES: NOT DETECTED
Tetrahydrocannabinol: NOT DETECTED

## 2017-05-15 LAB — TROPONIN I: Troponin I: 0.06 ng/mL (ref <0.03)

## 2017-05-15 LAB — COMPREHENSIVE METABOLIC PANEL
ALK PHOS: 292 U/L — AB (ref 38–126)
ALT: 26 U/L (ref 14–54)
AST: 38 U/L (ref 15–41)
Albumin: 3.7 g/dL (ref 3.5–5.0)
Anion gap: 11 (ref 5–15)
BILIRUBIN TOTAL: 0.4 mg/dL (ref 0.3–1.2)
BUN: 17 mg/dL (ref 6–20)
CALCIUM: 9.3 mg/dL (ref 8.9–10.3)
CO2: 21 mmol/L — ABNORMAL LOW (ref 22–32)
CREATININE: 0.88 mg/dL (ref 0.44–1.00)
Chloride: 96 mmol/L — ABNORMAL LOW (ref 101–111)
GFR calc Af Amer: >60 mL/min (ref >60)
GFR, EST NON AFRICAN AMERICAN: 55 mL/min — AB (ref >60)
Glucose, Bld: 120 mg/dL — ABNORMAL HIGH (ref 65–99)
POTASSIUM: 4.5 mmol/L (ref 3.5–5.1)
Sodium: 128 mmol/L — ABNORMAL LOW (ref 135–145)
TOTAL PROTEIN: 8.5 g/dL — AB (ref 6.5–8.1)

## 2017-05-15 LAB — URINALYSIS, ROUTINE W REFLEX MICROSCOPIC
Bilirubin Urine: NEGATIVE
Glucose, UA: NEGATIVE mg/dL
HGB URINE DIPSTICK: NEGATIVE
KETONES UR: NEGATIVE mg/dL
LEUKOCYTES UA: NEGATIVE
Nitrite: NEGATIVE
Protein, ur: NEGATIVE mg/dL
Specific Gravity, Urine: <1.005 — ABNORMAL LOW (ref 1.005–1.030)
pH: 6 (ref 5.0–8.0)

## 2017-05-15 LAB — CBG MONITORING, ED
Glucose-Capillary: 116 mg/dL — ABNORMAL HIGH (ref 65–99)
Glucose-Capillary: 146 mg/dL — ABNORMAL HIGH (ref 65–99)
Glucose-Capillary: 302 mg/dL — ABNORMAL HIGH (ref 65–99)

## 2017-05-15 LAB — ETHANOL

## 2017-05-15 MED ORDER — LOSARTAN POTASSIUM 50 MG PO TABS
100.0000 mg | ORAL_TABLET | Freq: Every day | ORAL | Status: DC
  Administered 2017-05-16 – 2017-05-20 (×5): 100 mg via ORAL
  Filled 2017-05-15 (×5): qty 2

## 2017-05-15 MED ORDER — SODIUM CHLORIDE 0.9 % IV SOLN: 100.0000 mL/h | INTRAVENOUS | Status: DC

## 2017-05-15 MED ORDER — ATORVASTATIN CALCIUM 20 MG PO TABS: 20.0000 mg | ORAL_TABLET | Freq: Every day | ORAL | Status: DC

## 2017-05-15 MED ORDER — ACETAMINOPHEN 160 MG/5ML PO SOLN: 650.0000 mg | ORAL | Status: DC | PRN

## 2017-05-15 MED ORDER — ACETAMINOPHEN 325 MG PO TABS
650.0000 mg | ORAL_TABLET | ORAL | Status: DC | PRN
  Administered 2017-05-16 – 2017-05-18 (×2): 650 mg via ORAL
  Filled 2017-05-15 (×2): qty 2

## 2017-05-15 MED ORDER — ASPIRIN EC 325 MG PO TBEC
325.0000 mg | DELAYED_RELEASE_TABLET | Freq: Once | ORAL | Status: AC
  Administered 2017-05-15: 325 mg via ORAL
  Filled 2017-05-15: qty 1

## 2017-05-15 MED ORDER — STROKE: EARLY STAGES OF RECOVERY BOOK
Freq: Once | Status: AC
  Administered 2017-05-16: 17:00:00
  Filled 2017-05-15: qty 1

## 2017-05-15 MED ORDER — POLYETHYLENE GLYCOL 3350 17 G PO PACK
17.0000 g | PACK | Freq: Every day | ORAL | Status: DC
  Administered 2017-05-18 – 2017-05-20 (×3): 17 g via ORAL
  Filled 2017-05-15 (×5): qty 1

## 2017-05-15 MED ORDER — LEVOTHYROXINE SODIUM 75 MCG PO TABS
75.0000 ug | ORAL_TABLET | Freq: Every day | ORAL | Status: DC
  Administered 2017-05-16 – 2017-05-20 (×5): 75 ug via ORAL
  Filled 2017-05-15 (×5): qty 1

## 2017-05-15 MED ORDER — DIAZEPAM 2 MG PO TABS: 2.0000 mg | ORAL_TABLET | Freq: Four times a day (QID) | ORAL | Status: DC | PRN

## 2017-05-15 MED ORDER — SODIUM CHLORIDE 0.9 % IV SOLN
Freq: Once | INTRAVENOUS | Status: AC
  Administered 2017-05-15: 21:00:00 via INTRAVENOUS

## 2017-05-15 MED ORDER — ACETAMINOPHEN 650 MG RE SUPP: 650.0000 mg | RECTAL | Status: DC | PRN

## 2017-05-15 MED ORDER — ENOXAPARIN SODIUM 40 MG/0.4ML ~~LOC~~ SOLN
40.0000 mg | SUBCUTANEOUS | Status: DC
  Administered 2017-05-15 – 2017-05-19 (×5): 40 mg via SUBCUTANEOUS
  Filled 2017-05-15 (×6): qty 0.4

## 2017-05-15 MED ORDER — SODIUM CHLORIDE 0.9 % IV BOLUS
500.0000 mL | Freq: Once | INTRAVENOUS | Status: AC
  Administered 2017-05-15: 500 mL via INTRAVENOUS

## 2017-05-15 MED ORDER — METOPROLOL TARTRATE 25 MG PO TABS
25.0000 mg | ORAL_TABLET | Freq: Two times a day (BID) | ORAL | Status: DC
  Administered 2017-05-15 – 2017-05-16 (×3): 25 mg via ORAL
  Filled 2017-05-15 (×3): qty 1

## 2017-05-15 MED ORDER — CLOPIDOGREL BISULFATE 75 MG PO TABS
75.0000 mg | ORAL_TABLET | Freq: Every day | ORAL | Status: DC
  Administered 2017-05-16 – 2017-05-20 (×5): 75 mg via ORAL
  Filled 2017-05-15 (×5): qty 1

## 2017-05-15 MED ORDER — ASPIRIN 81 MG PO CHEW
81.0000 mg | CHEWABLE_TABLET | Freq: Every day | ORAL | Status: DC
  Administered 2017-05-16 – 2017-05-20 (×5): 81 mg via ORAL
  Filled 2017-05-15 (×5): qty 1

## 2017-05-15 MED ORDER — ROSUVASTATIN CALCIUM 20 MG PO TABS
20.0000 mg | ORAL_TABLET | Freq: Every day | ORAL | Status: DC
  Administered 2017-05-16 – 2017-05-20 (×5): 20 mg via ORAL
  Filled 2017-05-15 (×5): qty 1

## 2017-05-15 NOTE — ED Notes (Signed)
Nephew and niece --home [phone] cell [phone]

## 2017-05-15 NOTE — ED Notes (Signed)
Patient transported to X-ray 

## 2017-05-15 NOTE — ED Notes (Signed)
Stroke swallow screen deferred for now due to pt being lethargic.

## 2017-05-15 NOTE — Consult Note (Signed)
   [facility] TeleNeurology Consult Services  Impression: Weakness, left sided weakness now resolved-  right hemispheric tia. r/o other etiologies such as occult infection   Not a tpa candidate due to: no focal findings on exam, resolving symptoms Does not meet LVO screening criteria (no aphasia, neglect, gaze deviation, dense hemiparesis, visual field deficits on exam); therefore, advanced imaging not recommended.   Differential Diagnosis:   1. Cardioembolic stroke  2. Small vessel disease/lacune  3. Thromboembolic, artery-to-artery mechanism  4. Hypercoagulable state-related infarct  5. Transient ischemic attack  6. Thrombotic mechanism, large artery disease   Comments:   [facility] contacted: [phone] [facility] at bedside:  1122 NIHSS assessment time:  1122  Recommendations:  Activate Stroke protocol admission/order set  Stroke/telemetry floor Neuro checks Bedside swallow eval Dvt prophylaxis IV fluids, normal saline ASA if no contraindications Head of bed below 30 degrees Euglycemia and avoid hyperthermia (prn acetaminophen)  inpatient neurology consultation Inpatient stroke evaluation as per Neurology/ Internal Medicine Discussed with ED MD  -----------------------------------------------------------------------------------------  CC confusion, left sided weakness  History of Present Illness   Patient is a  84 year old woman who was brought to the hospital for a BG of 22. Per story, this was a random draw and the patient was doing well when it was taken. When she arrived to the hospital she was tired, but per Ed MD once she passed gas, she felt better. She was able to walk to the bathroom on her own. After going to the bathroom, she apparently had an episode of confusion and left sided weakness, which lasted about 2 minutes and slow improved after that. The patient feels tired at this time.  Diagnostic: Ct head without contrast: no changes from previous,  no acute findings per rad read  Exam:  NIHSS score: 1 1A: Level of Consciousness - Arouses to minor stimulation +1 1B: Ask Month and Age - Both Questions Right 0 1C: 'Blink Eyes' & 'Squeeze Hands' - Performs Both Tasks 0 2: Test Horizontal Extraocular Movements - Normal 0 3: Test Visual Fields - No Visual Loss 0 4: Test Facial Palsy - Normal symmetry 0 5A: Test Left Arm Motor Drift - No Drift for 10 Seconds 0 5B: Test Right Arm Motor Drift - No Drift for 10 Seconds 0 6A: Test Left Leg Motor Drift - No Drift for 5 Seconds 0 6B: Test Right Leg Motor Drift - No Drift for 5 Seconds 0 7: Test Limb Ataxia - No Ataxia 0 8: Test Sensation - Normal; No sensory loss 0 9: Test Language/Aphasia - Normal; No aphasia 0 10: Test Dysarthria - Normal 0 11: Test Extinction/Inattention - No abnormality 0      Medical Decision Making:  - Extensive number of diagnosis or management options are considered above.   - Extensive amount of complex data reviewed.   - High risk of complication and/or morbidity or mortality are associated with differential diagnostic considerations above.  - There may be Uncertain outcome and increased probability of prolonged functional impairment or high probability of severe prolonged functional impairment associated with some of these differential diagnosis.  Medical Data Reviewed:  1.Data reviewed include clinical labs, radiology,  Medical Tests;   2.Tests results discussed w/performing or interpreting physician;   3.Obtaining/reviewing old medical records;  4.Obtaining case history from another source;  5.Independent review of image, tracing or specimen.    Patient was informed the Neurology Consult would happen via telehealth (remote video) and consented to receiving care in this manner.

## 2017-05-15 NOTE — ED Notes (Signed)
Pt given OJ after passing swallow screen

## 2017-05-15 NOTE — ED Notes (Signed)
[facility] called for transport to  

## 2017-05-15 NOTE — ED Notes (Signed)
Approx 11am, I went in with Rutherford Guys, RN in attempt to start an IV on the patient.  Pt had a rightward gaze and would not look at me on her left side. Pt was stuck in the left and the right hand and did pull away from this RN. Pt unable to verbalizes as she had previously in the day. Pt noted to have a right facial droop that improved very quickly. Pt unable to follow commands to test strength or complete a full NIH at that time. Pt unable to state name or birthday. EDP made aware and at bedside shortly afterwards. CBG 116, EKG obtained, Full set of VS obtained, IV established.

## 2017-05-15 NOTE — ED Notes (Signed)
Per [facility] from Dr. Particia Nearing NS bolus running at 125/hr

## 2017-05-15 NOTE — ED Notes (Signed)
Pt assisted to bedside commode- voided approx 200cc

## 2017-05-15 NOTE — ED Notes (Signed)
Pt bladder scanned for 585cc

## 2017-05-15 NOTE — H&P (Addendum)
History and Physical    Debbie Ortiz ZOX:[id number] DOB: 09/29/1923 DOA: 05/15/2017  **Will place patient in observation status based on the expectation that the patient will need hospitalization/ hospital care or less than or equal to 24 hours  PCP: Patient, No Pcp Per   Attending physician: Luberta Robertson  Patient coming from/Resides with: [facility]  Chief Complaint: TIA symptoms  HPI: Debbie Ortiz is a 84 y.o. female with medical history significant for CHF unknown subtype, hypertension, hypothyroidism, diabetes on oral agents.  Patient with recent issues regarding urinary retention was negative for infectious etiology.  Patient was seen in the ER on 4/26 after a fall with no significant abnormalities detected so she was sent back to the nursing facility.  This morning patient was discovered with a CBG of 22 and EMS was called to the facility.  Prior to their arrival patient had been given orange juice to drink and follow-up CBG was 127.  She was brought to [facility] further evaluation.  No significant abnormalities were found.  Patient had distended abdomen.  She was able to go to the bathroom and passed urine and flatus and symptoms improved and plan was to discharge patient back to facility.  In route to room from bathroom patient developed slurred speech and left-sided weakness.  Telemetry neurology was consulted and by the time they were able to evaluate the patient her neurological symptoms had resolved.  Recommendation was to transport patient to [facility] for TIA evaluation (including MRI).  ED Course:  Vital Signs: BP (!) 169/61   Pulse (!) 58   Temp 98 F (36.7 C) (Oral)   Resp 16   Ht  (1.626 m)   Wt 77.1 kg (170 lb)   SpO2 99%   BMI 29.18 kg/m  CT head/stroke protocol: Negative AAS: Neg Lab data: Sodium 128, potassium 4.5, chloride 96, CO2 21, glucose 120, BUN 17, creatinine 0.88, alkaline phosphatase 292 with otherwise normal LFTs, troponin 0.06, white  count 5700 with normal differential, hemoglobin 11.9 with MCV 77, urinalysis unremarkable except for low specific gravity <1.005, UDS positive for benzodiazepines for which patient is prescribed Medications and treatments: Saline bolus x500 cc, normal saline 100 cc/hr, aspirin 325 mg x 1  Review of Systems:  In addition to the HPI above,  No Fever-chills, myalgias or other constitutional symptoms No Headache, changes with Vision or hearing, tingling, numbness in any extremity, word finding difficulty, gait disturbance or imbalance, tremors or seizure activity No problems swallowing food or Liquids, indigestion/reflux, choking or coughing while eating, abdominal pain with or after eating No Chest pain, Cough or Shortness of Breath, palpitations, orthopnea or DOE No Abdominal pain, N/V, melena,hematochezia, dark tarry stools, constipation No dysuria, malodorous urine, hematuria or flank pain No new skin rashes, lesions, masses or bruises, No new joint pains, aches, swelling or redness No recent unintentional weight gain or loss No polyuria, polydypsia or polyphagia   Past Medical History:  Diagnosis Date  . Arthritis   . CHF (congestive heart failure) (HCC)   . Diabetes mellitus without complication (HCC)   . Hypertension   . Thyroid disease     History reviewed. No pertinent surgical history.  Social History   Socioeconomic History  . Marital status: Widowed    Spouse name: Not on file  . Number of children: Not on file  . Years of education: Not on file  . Highest education level: Not on file  Occupational History  . NoAlysse Rathe on file  Social Needs  . Financial resource strain: Not on file  . Food insecurity:    Worry: Not on file    Inability: Not on file  . Transportation needs:    Medical: Not on file    Non-medical: Not on file  Tobacco Use  . Smoking status: Never Smoker  . Smokeless tobacco: Never Used  Substance and Sexual Activity  . Alcohol use: Not Currently  .  Drug use: Never  . Sexual activity: Not Currently  Lifestyle  . Physical activity:    Days per week: Not on file    Minutes per session: Not on file  . Stress: Not on file  Relationships  . Social connections:    Talks on phone: Not on file    Gets together: Not on file    Attends religious service: Not on file    Active member of club or organization: Not on file    Attends meetings of clubs or organizations: Not on file    Relationship status: Not on file  . Intimate partner violence:    Fear of current or ex partner: Not on file    Emotionally abused: Not on file    Physically abused: Not on file    Forced sexual activity: Not on file  Other Topics Concern  . Not on file  Social History Narrative  . Not on file    Mobility: Rolling walker Work history: Retired Designer, jewellery   Allergies  Allergen Reactions  . Codeine   . Sulfa Antibiotics     Family history reviewed and not pertinent to current admission and diagnosis  Prior to Admission medications   Medication Sig Start Date End Date Taking? Authorizing Provider  glipiZIDE (GLUCOTROL XL) 5 MG 24 hr tablet Take 5 mg by mouth daily with breakfast.   Yes [provider]  metFORMIN (GLUCOPHAGE) 500 MG tablet Take 500 mg by mouth daily with breakfast.   Yes [provider]  rosuvastatin (CRESTOR) 20 MG tablet Take 20 mg by mouth daily.   Yes [provider]  spironolactone (ALDACTONE) 25 MG tablet Take 25 mg by mouth daily.   Yes [provider]  aspirin 81 MG chewable tablet Chew by mouth daily.    [provider]  atorvastatin (LIPITOR) 20 MG tablet Take 20 mg by mouth daily.    [provider]  clopidogrel (PLAVIX) 75 MG tablet Take 75 mg by mouth daily.    [provider]  diazepam (VALIUM) 2 MG tablet Take 2 mg by mouth every 6 (six) hours as needed for anxiety.    [provider]  furosemide (LASIX) 20 MG tablet Take 40 mg by mouth 2 (two)  times daily.     [provider]  levothyroxine (SYNTHROID, LEVOTHROID) 100 MCG tablet Take 75 mcg by mouth daily before breakfast.     [provider]  losartan (COZAAR) 25 MG tablet Take 100 mg by mouth daily.     [provider]  metoprolol tartrate (LOPRESSOR) 25 MG tablet Take 12.5 mg by mouth 2 (two) times daily.     [provider]  polyethylene glycol (MIRALAX / GLYCOLAX) packet Take 17 g by mouth daily.    [provider]    Physical Exam: Vitals:   05/15/17 1412 05/15/17 1500 05/15/17 1521 05/15/17 1632  BP:  (!) 143/52 (!) 153/54 (!) 169/61  Pulse:  72 64 (!) 58  Resp:  Temp: 98 F (36.7 C)  98 F (36.7 C)  TempSrc:    Oral  SpO2:  98% 99% 99%  Weight:      Height:          Constitutional: NAD, calm, comfortable Eyes: PERRL, lids and conjunctivae normal ENMT: Mucous membranes are moist. Posterior pharynx clear of any exudate or lesions.Normal dentition.  Neck: normal, supple, no masses, no thyromegaly Respiratory: clear to auscultation bilaterally, no wheezing, no crackles. Normal respiratory effort. No accessory muscle use.  Cardiovascular: Regular rate and rhythm, no murmurs / rubs / gallops. No extremity edema. 2+ pedal pulses. No carotid bruits.  Abdomen: no tenderness, no masses palpated. No hepatosplenomegaly. Bowel sounds positive.  Abdomen somewhat distended and slightly tender over suprapubic area. Musculoskeletal: no clubbing / cyanosis. No joint deformity upper and lower extremities. Good ROM, no contractures. Normal muscle tone.  Skin: no rashes, lesions, ulcers. No induration Neurologic: CN 2-12 grossly intact subtle right facial drooping that resolves with smiling. Sensation intact, DTR normal. Strength 5/5 x all 4 extremities.  Psychiatric: Normal judgment and insight. Alert and oriented x 3. Normal mood.    Labs on Admission: I have personally reviewed following labs and imaging  studies  CBC: Recent Labs  Lab 05/13/17 1626 05/15/17 1122  WBC 7.3 5.7  NEUTROABS 4.4 2.5  HGB 11.6* 11.9*  HCT 33.8* 34.4*  MCV 77.5* 77.0*  PLT 384 392   Basic Metabolic Panel: Recent Labs  Lab 05/13/17 1626 05/15/17 1122  NA 128* 128*  K 4.5 4.5  CL 93* 96*  CO2 19* 21*  GLUCOSE 59* 120*  BUN 16 17  CREATININE 1.00 0.88  CALCIUM 9.7 9.3   GFR: Estimated Creatinine Clearance: 41 mL/min (by C-G formula based on SCr of 0.88 mg/dL). Liver Function Tests: Recent Labs  Lab 05/13/17 1626 05/15/17 1122  AST 37 38  ALT 25 26  ALKPHOS 306* 292*  BILITOT 0.6 0.4  PROT 8.0 8.5*  ALBUMIN 3.4* 3.7   No results for input(s): LIPASE, AMYLASE in the last 168 hours. No results for input(s): AMMONIA in the last 168 hours. Coagulation Profile: No results for input(s): INR, PROTIME in the last 168 hours. Cardiac Enzymes: Recent Labs  Lab 05/15/17 1122  TROPONINI 0.06*   BNP (last 3 results) No results for input(s): PROBNP in the last 8760 hours. HbA1C: No results for input(s): HGBA1C in the last 72 hours. CBG: Recent Labs  Lab 05/13/17 1808 05/15/17 1051 05/15/17 1510  GLUCAP 66 116* 146*   Lipid Profile: No results for input(s): CHOL, HDL, LDLCALC, TRIG, CHOLHDL, LDLDIRECT in the last 72 hours. Thyroid Function Tests: No results for input(s): TSH, T4TOTAL, FREET4, T3FREE, THYROIDAB in the last 72 hours. Anemia Panel: No results for input(s): VITAMINB12, FOLATE, FERRITIN, TIBC, IRON, RETICCTPCT in the last 72 hours. Urine analysis:    Component Value Date/Time   COLORURINE YELLOW 05/15/2017 1011   APPEARANCEUR CLEAR 05/15/2017 1011   LABSPEC <1.005 (L) 05/15/2017 1011   PHURINE 6.0 05/15/2017 1011   GLUCOSEU NEGATIVE 05/15/2017 1011   HGBUR NEGATIVE 05/15/2017 1011   BILIRUBINUR NEGATIVE 05/15/2017 1011   KETONESUR NEGATIVE 05/15/2017 1011   PROTEINUR NEGATIVE 05/15/2017 1011   NITRITE NEGATIVE 05/15/2017 1011   LEUKOCYTESUR NEGATIVE 05/15/2017 1011    Sepsis Labs: (procalcitonin:4,lacticidven:4) ) Recent Results (from the past 240 hour(s))  Urine culture     Status: Abnormal   Collection Time: 05/13/17  3:20 PM  Result Value Ref Range Status   Specimen Description URINE, RANDOM  Final   Special  Requests NONE  Final   Culture (A)  Final    <10,000 COLONIES/mL INSIGNIFICANT GROWTH Performed at [facility], [address], [place], [place] [address]    Report Status 05/14/2017 FINAL  Final     Radiological Exams on Admission: Dg Abdomen Acute W/chest  Result Date: 05/15/2017 CLINICAL DATA:  Constipation.  Distention. EXAM: DG ABDOMEN ACUTE W/ 1V CHEST COMPARISON:  Chest x-ray May 13, 2017 FINDINGS: Mild prominence of soft tissue to the right of the trachea is probably ectatic vasculature. This is unchanged since April 15, 2017. The heart size borderline. The hila and mediastinum are unchanged. No pneumothorax. No nodules or masses. No focal infiltrates. No free air, portal venous gas, or pneumatosis. The bowel gas pattern is normal with no evidence of obstruction. Vascular calcifications are noted. No other acute abnormalities. IMPRESSION: 1. No acute abnormality to explain the patient's symptoms. Electronically Signed   By: Gerome Sam III M.D   On: 05/15/2017 10:57   Ct Head Code Stroke Wo Contrast  Addendum Date: 05/15/2017   ADDENDUM REPORT: 05/15/2017 11:50 ADDENDUM: These results were called by telephone at the time of interpretation on 05/15/2017 at 11:50 am to Dr. Jacalyn Lefevre , who verbally acknowledged these results. Electronically Signed   By: Elsie Stain M.D.   On: 05/15/2017 11:50   Result Date: 05/15/2017 CLINICAL DATA:  Code stroke.  Sudden onset of altered mental status. EXAM: CT HEAD WITHOUT CONTRAST TECHNIQUE: Contiguous axial images were obtained from the base of the skull through the vertex without intravenous contrast. COMPARISON:  05/13/2017. FINDINGS: Brain: No evidence for acute  infarction, hemorrhage, mass lesion, hydrocephalus, or extra-axial fluid. Mild atrophy. Hypoattenuation of white matter, consistent with small vessel disease. Vascular: Calcification of the cavernous internal carotid arteries consistent with cerebrovascular atherosclerotic disease. No signs of intracranial large vessel occlusion. Skull: No fracture.  Marked hyperostosis. Sinuses/Orbits: No acute sinus disease. No orbital findings of significance. Other: None. ASPECTS [facility] Stroke Program Early CT Score) - Ganglionic level infarction (caudate, lentiform nuclei, internal capsule, insula, M1-M3 cortex): 7 - Supraganglionic infarction (M4-M6 cortex): 3 Total score (0-10 with 10 being normal): 10 IMPRESSION: 1. Mild atrophy and small vessel disease, unchanged from recent priors. No acute stroke is evident. 2. ASPECTS is 10. These results were communicated to Dr. Laurence Slate at 11:40 amon 4/28/2019by text page via the [facility] messaging system. Electronically Signed: By: Elsie Stain M.D. On: 05/15/2017 11:41    EKG: (Independently reviewed) sinus rhythm with ventricular rate 67 bpm, QTC 416 ms, normal R wave irritation, nonspecific ST segment changes and V4 through V6 ~ 1 mm unchanged from previous EKG March 2019  Assessment/Plan Principal Problem:   TIA (transient ischemic attack) -Patient presented in context of recent hypoglycemia and developed transient slurred speech and left-sided weakness which has completely resolved -TIA work-up -CT head negative -MRI/MRA brain -Echocardiogram/carotid duplex -HgbA1c/FLP -PT/OT/SLP evaluation -Neurological checks -patient is on Plavix at home. She was given aspirin at [facility]. Would recommend referral to neurology to get advice on further versus different antiplatelet therapy for secondary prevention of stroke. -Continue preadmission statin-review of med list from [facility] [employer] on 03/25/2017 demonstrates patient no longer on Lipitor and instead is on  Crestor -Evaluated by tele-neurologist at [facility]  Active Problems:   Diabetes mellitus without complication  -Recent issues with hypoglycemia that responded to orange juice at facility -Hold preadmission metformin and Glucotrol -Follow CBGs every 4 hours -Carb modified diet    Elevated troponin -Uncertain  significance noting patient without chest pain and stable -For completeness of evaluation cycle troponin    Acute hyponatremia  -Likely related to preadmission utilization of both Lasix and Aldactone -Has received normal saline IV fluid since arrival to ER -For completeness of evaluation obtain serum osmolality, urine osmolality and urine sodium -Holding diuretics for now    Hypertension -Patient on diuretics as well as ARB and beta-blocker prior to admission -Continue ARB and beta-blocker    History of CHF (congestive heart failure) -Subtype unknown -Prior to admission patient on Aldactone, twice daily Lasix, low-dose beta-blocker and high-dose ARB -Daily weights, strict I's/O -No prior echo available -Follow-up on echocardiogram as ordered above for TIA evaluation    History of urinary retention -Documented with recent issues regarding urinary retention -Clinical exam consistent with possible continued retention so we will have nurse perform bladder scan -Suspect patient may have underlying neurogenic bladder -If continues to demonstrate urinary retention consider addition of Flomax given advanced age    Hypothyroidism -Continue Synthroid    **Additional lab, imaging and/or diagnostic evaluation at discretion of supervising physician  DVT prophylaxis: Lovenox Code Status: Full Family Communication: Family at bedside Disposition Plan: Nursing facility Consults called: Tele-neurology/[facility]    Russella Dar ANP-BC [facility] Pager [phone]   If 7PM-7AM, please contact night-coverage [email or web address] Password [facility]  05/15/2017, 5:04 PM

## 2017-05-15 NOTE — ED Provider Notes (Signed)
[facility] EMERGENCY DEPARTMENT Provider Note   CSN: [id number] Arrival date & time: 05/15/17  0859     History   Chief Complaint Chief Complaint  Patient presents with  . Code Stroke  . Urinary Retention    HPI Debbie Ortiz is a 84 y.o. female.  Pt presents to the ED feeling weak and tired.  Pt lives at independent living.  EMS was called out initially for low blood sugar.  The facility got 22.  However, she was awake and alert.  When EMS arrived, her BS was 127 and nothing had been given.  Pt said she has felt like she is retaining urine.  She denies any pain with urination.  She was here on 4/26 for a fall.  Her sodium was slightly low, but otherwise work up was ok.  Pt said her abdomen feels a little tight.     Past Medical History:  Diagnosis Date  . Arthritis   . CHF (congestive heart failure) (HCC)   . Diabetes mellitus without complication (HCC)   . Hypertension   . Thyroid disease     Patient Active Problem List   Diagnosis Date Noted  . TIA (transient ischemic attack) 05/15/2017    History reviewed. No pertinent surgical history.   OB History   None      Home Medications    Prior to Admission medications   Medication Sig Start Date End Date Taking? Authorizing Provider  aspirin 81 MG chewable tablet Chew by mouth daily.    [provider]  atorvastatin (LIPITOR) 20 MG tablet Take 20 mg by mouth daily.    [provider]  clopidogrel (PLAVIX) 75 MG tablet Take 75 mg by mouth daily.    [provider]  diazepam (VALIUM) 2 MG tablet Take 2 mg by mouth every 6 (six) hours as needed for anxiety.    [provider]  furosemide (LASIX) 20 MG tablet Take 20 mg by mouth.    [provider]  HYDROcodone-acetaminophen (NORCO/VICODIN) 5-325 MG tablet Take 1 tablet by mouth every 6 (six) hours as needed for moderate pain.    [provider]  levothyroxine (SYNTHROID, LEVOTHROID) 100 MCG tablet  Take 100 mcg by mouth daily before breakfast.    [provider]  losartan (COZAAR) 25 MG tablet Take 25 mg by mouth daily.    [provider]  metoprolol tartrate (LOPRESSOR) 25 MG tablet Take 25 mg by mouth 2 (two) times daily.    [provider]  nitroGLYCERIN (NITROSTAT) 0.4 MG SL tablet Place 0.4 mg under the tongue every 5 (five) minutes as needed for chest pain.    [provider]  polyethylene glycol (MIRALAX / GLYCOLAX) packet Take 17 g by mouth daily.    [provider]    Family History History reviewed. No pertinent family history.  Social History Social History   Tobacco Use  . Smoking status: Never Smoker  . Smokeless tobacco: Never Used  Substance Use Topics  . Alcohol use: Not Currently  . Drug use: Never     Allergies   Codeine and Sulfa antibiotics   Review of Systems Review of Systems  Genitourinary: Positive for difficulty urinating.  Neurological: Positive for weakness.  All other systems reviewed and are negative.    Physical Exam Updated Vital Signs BP (!) 144/69   Pulse (!) 57   Temp 98 F (36.7 C)   Resp 14   Ht  (1.626 m)  Wt 77.1 kg (170 lb)   SpO2 99%   BMI 29.18 kg/m   Physical Exam  Constitutional: She is oriented to person, place, and time. She appears well-developed and well-nourished.  HENT:  Head: Normocephalic and atraumatic.  Right Ear: External ear normal.  Left Ear: External ear normal.  Nose: Nose normal.  Mouth/Throat: Oropharynx is clear and moist.  Eyes: Pupils are equal, round, and reactive to light. Conjunctivae and EOM are normal.  Neck: Normal range of motion. Neck supple.  Cardiovascular: Normal rate, regular rhythm, normal heart sounds and intact distal pulses.  Pulmonary/Chest: Effort normal and breath sounds normal.  Abdominal: Soft. Bowel sounds are normal.  Musculoskeletal: Normal range of motion.  Neurological: She is alert and oriented to person, place,  and time.  Skin: Skin is warm. Capillary refill takes less than 2 seconds.  Psychiatric: She has a normal mood and affect. Her behavior is normal. Judgment and thought content normal.  Nursing note and vitals reviewed.    ED Treatments / Results  Labs (all labs ordered are listed, but only abnormal results are displayed) Labs Reviewed  COMPREHENSIVE METABOLIC PANEL - Abnormal; Notable for the following components:      Result Value   Sodium 128 (*)    Chloride 96 (*)    CO2 21 (*)    Glucose, Bld 120 (*)    Total Protein 8.5 (*)    Alkaline Phosphatase 292 (*)    GFR calc non Af Amer 55 (*)    All other components within normal limits  CBC WITH DIFFERENTIAL/PLATELET - Abnormal; Notable for the following components:   Hemoglobin 11.9 (*)    HCT 34.4 (*)    MCV 77.0 (*)    Monocytes Absolute 1.1 (*)    All other components within normal limits  URINALYSIS, ROUTINE W REFLEX MICROSCOPIC - Abnormal; Notable for the following components:   Specific Gravity, Urine <1.005 (*)    All other components within normal limits  TROPONIN I - Abnormal; Notable for the following components:   Troponin I 0.06 (*)    All other components within normal limits  RAPID URINE DRUG SCREEN, HOSP PERFORMED - Abnormal; Notable for the following components:   Benzodiazepines POSITIVE (*)    All other components within normal limits  CBG MONITORING, ED - Abnormal; Notable for the following components:   Glucose-Capillary 116 (*)    All other components within normal limits  ETHANOL  PROTIME-INR    EKG EKG Interpretation  Date/Time:  Sunday May 15 2017 10:59:49 EDT Ventricular Rate:  67 PR Interval:    QRS Duration: 95 QT Interval:  394 QTC Calculation: 416 R Axis:   -18 Text Interpretation:  Sinus rhythm Borderline left axis deviation Borderline repolarization abnormality Confirmed by Jacalyn Lefevre [phone]) on 05/15/2017 11:02:00 AM Also confirmed by Jacalyn Lefevre [phone]), editor Sheppard Evens ([id number])  on 05/15/2017 1:45:29 PM   Radiology Dg Chest 2 View  Result Date: 05/13/2017 CLINICAL DATA:  Larey Seat trying to get out of bed, fatigue, weakness EXAM: CHEST - 2 VIEW COMPARISON:  Chest x-ray of 04/15/2017 FINDINGS: No active infiltrate or effusion is seen. Prominent soft tissue in the right paratracheal region probably represents ectatic great vessels but attention to this area on followup chest x-ray is recommended. Cardiomegaly is stable. No acute bony abnormality is seen. There is mild degenerative change in both shoulders. IMPRESSION: 1. No active lung disease. 2. Prominent soft tissue in the right superior paratracheal region most likely ectatic great  vessels but recommend attention to this area on followup chest x-ray. 3. Stable cardiomegaly. Electronically Signed   By: Dwyane Dee M.D.   On: 05/13/2017 16:15   Dg Wrist Complete Right  Result Date: 05/13/2017 CLINICAL DATA:  Fall. EXAM: RIGHT HAND - COMPLETE 3+ VIEW; RIGHT WRIST - COMPLETE 3+ VIEW COMPARISON:  None. FINDINGS: No acute fracture or dislocation. Mild osteoarthritis of the first [facility] joint and scaphotrapeziotrapezoid joint. Diffuse osteopenia. Chondrocalcinosis of the TFCC. Soft tissues are unremarkable. IMPRESSION: 1.  No acute osseous abnormality. Electronically Signed   By: Obie Dredge M.D.   On: 05/13/2017 16:18   Ct Head Wo Contrast  Result Date: 05/13/2017 CLINICAL DATA:  Larey Seat getting out of bed today. EXAM: CT HEAD WITHOUT CONTRAST CT CERVICAL SPINE WITHOUT CONTRAST TECHNIQUE: Multidetector CT imaging of the head and cervical spine was performed following the standard protocol without intravenous contrast. Multiplanar CT image reconstructions of the cervical spine were also generated. COMPARISON:  None. FINDINGS: CT HEAD FINDINGS Brain: No evidence of acute infarction, hemorrhage, hydrocephalus, extra-axial collection or mass lesion/mass effect. Mild periventricular white matter hypoattenuation is noted  consistent with chronic microvascular ischemic change. Vascular: No hyperdense vessel or unexpected calcification. Skull: Normal. Negative for fracture or focal lesion. Sinuses/Orbits: Globes and orbits are unremarkable. Sinuses and mastoid air cells are clear. Other: None. CT CERVICAL SPINE FINDINGS Alignment: Normal. Skull base and vertebrae: No acute fracture. No primary bone lesion or focal pathologic process. Soft tissues and spinal canal: No prevertebral fluid or swelling. No visible canal hematoma. Disc levels: There are degenerative changes throughout the cervical spine. There is marked loss of disc height with endplate sclerosis and osteophytes at C3-C4 moderate loss of disc height and endplate spurring at C5-C6. Facet degenerative changes are noted bilaterally greatest on the left at C4-C5. No convincing disc herniation. Upper chest: No acute findings.  Minor scarring at the lung apices. Other: None. IMPRESSION: HEAD CT 1. No acute intracranial abnormalities. 2. Mild chronic microvascular ischemic change. CERVICAL CT 1. No fracture or acute finding. Electronically Signed   By: Amie Portland M.D.   On: 05/13/2017 15:48   Ct Cervical Spine Wo Contrast  Result Date: 05/13/2017 CLINICAL DATA:  Larey Seat getting out of bed today. EXAM: CT HEAD WITHOUT CONTRAST CT CERVICAL SPINE WITHOUT CONTRAST TECHNIQUE: Multidetector CT imaging of the head and cervical spine was performed following the standard protocol without intravenous contrast. Multiplanar CT image reconstructions of the cervical spine were also generated. COMPARISON:  None. FINDINGS: CT HEAD FINDINGS Brain: No evidence of acute infarction, hemorrhage, hydrocephalus, extra-axial collection or mass lesion/mass effect. Mild periventricular white matter hypoattenuation is noted consistent with chronic microvascular ischemic change. Vascular: No hyperdense vessel or unexpected calcification. Skull: Normal. Negative for fracture or focal lesion.  Sinuses/Orbits: Globes and orbits are unremarkable. Sinuses and mastoid air cells are clear. Other: None. CT CERVICAL SPINE FINDINGS Alignment: Normal. Skull base and vertebrae: No acute fracture. No primary bone lesion or focal pathologic process. Soft tissues and spinal canal: No prevertebral fluid or swelling. No visible canal hematoma. Disc levels: There are degenerative changes throughout the cervical spine. There is marked loss of disc height with endplate sclerosis and osteophytes at C3-C4 moderate loss of disc height and endplate spurring at C5-C6. Facet degenerative changes are noted bilaterally greatest on the left at C4-C5. No convincing disc herniation. Upper chest: No acute findings.  Minor scarring at the lung apices. Other: None. IMPRESSION: HEAD CT 1. No acute intracranial abnormalities. 2. Mild chronic microvascular  ischemic change. CERVICAL CT 1. No fracture or acute finding. Electronically Signed   By: Amie Portland M.D.   On: 05/13/2017 15:48   Dg Abdomen Acute W/chest  Result Date: 05/15/2017 CLINICAL DATA:  Constipation.  Distention. EXAM: DG ABDOMEN ACUTE W/ 1V CHEST COMPARISON:  Chest x-ray May 13, 2017 FINDINGS: Mild prominence of soft tissue to the right of the trachea is probably ectatic vasculature. This is unchanged since April 15, 2017. The heart size borderline. The hila and mediastinum are unchanged. No pneumothorax. No nodules or masses. No focal infiltrates. No free air, portal venous gas, or pneumatosis. The bowel gas pattern is normal with no evidence of obstruction. Vascular calcifications are noted. No other acute abnormalities. IMPRESSION: 1. No acute abnormality to explain the patient's symptoms. Electronically Signed   By: Gerome Sam III M.D   On: 05/15/2017 10:57   Dg Hand Complete Right  Result Date: 05/13/2017 CLINICAL DATA:  Fall. EXAM: RIGHT HAND - COMPLETE 3+ VIEW; RIGHT WRIST - COMPLETE 3+ VIEW COMPARISON:  None. FINDINGS: No acute fracture or  dislocation. 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 Diffuse osteopenia. Chondrocalcinosis of the TFCC. Soft tissues are unremarkable. IMPRESSION: 1.  No acute osseous abnormality. Electronically Signed   By: Obie Dredge M.D.   On: 05/13/2017 16:18   Ct Head Code Stroke Wo Contrast  Addendum Date: 05/15/2017   ADDENDUM REPORT: 05/15/2017 11:50 ADDENDUM: These results were called by telephone at the time of interpretation on 05/15/2017 at 11:50 am to Dr. Jacalyn Lefevre , who verbally acknowledged these results. Electronically Signed   By: Elsie Stain M.D.   On: 05/15/2017 11:50   Result Date: 05/15/2017 CLINICAL DATA:  Code stroke.  Sudden onset of altered mental status. EXAM: CT HEAD WITHOUT CONTRAST TECHNIQUE: Contiguous axial images were obtained from the base of the skull through the vertex without intravenous contrast. COMPARISON:  05/13/2017. FINDINGS: Brain: No evidence for acute infarction, hemorrhage, mass lesion, hydrocephalus, or extra-axial fluid. Mild atrophy. Hypoattenuation of white matter, consistent with small vessel disease. Vascular: Calcification of the cavernous internal carotid arteries consistent with cerebrovascular atherosclerotic disease. No signs of intracranial large vessel occlusion. Skull: No fracture.  Marked hyperostosis. Sinuses/Orbits: No acute sinus disease. No orbital findings of significance. Other: None. ASPECTS [facility] Stroke Program Early CT Score) - Ganglionic level infarction (caudate, lentiform nuclei, internal capsule, insula, M1-M3 cortex): 7 - Supraganglionic infarction (M4-M6 cortex): 3 Total score (0-10 with 10 being normal): 10 IMPRESSION: 1. Mild atrophy and small vessel disease, unchanged from recent priors. No acute stroke is evident. 2. ASPECTS is 10. These results were communicated to Dr. Laurence Slate at 11:40 amon 4/28/2019by text page via the [facility] messaging system. Electronically Signed: By: Elsie Stain M.D. On:  05/15/2017 11:41    Procedures Procedures (including critical care time)  Medications Ordered in ED Medications  0.9 %  sodium chloride infusion (has no administration in time range)  sodium chloride 0.9 % bolus 500 mL ( Intravenous Rate/Dose Change 05/15/17 1404)    Followed by  0.9 %  sodium chloride infusion (has no administration in time range)  aspirin EC tablet 325 mg (has no administration in time range)     Initial Impression / Assessment and Plan / ED Course  I have reviewed the triage vital signs and the nursing notes.  Pertinent labs & imaging results that were available during my care of the patient were reviewed by me and considered in my medical decision making (see chart  for details).     Pt went to the bathroom and was able to urinate without difficulty.  She also passed a large amount of gas and felt better.  Pt was looking so much better, that I was going to d/c her.  However, pt had an episode when she got into bed (after walking to the bathroom again) with slurred speech, weakness on the left. BS ok.   A code stroke was called.  By the time pt was seen by tele neurology, localizing sx have resolved.  The pt still fatigued and somnolent during neurology exam.  tPA not recommended.  About 1 hour after event, pt is back to her normal mental status.  She said she "went away, but came back."  She is not ready to "see Jesus yet."  Pt d/w Dr. Luberta Robertson ([facility]) who did accept her for transfer.  Unfortunately, there are many patients waiting for a bed.  I think it is best for the patient if she could get to [facility] to get an MRI and further stroke work up, so I spoke to Dr. Freida Busman (ED attending) who said it was ok for pt to come to the ED there to wait for a bed to become available.    CRITICAL CARE Performed by: Jacalyn Lefevre   Total critical care time: 40 minutes  Critical care time was exclusive of separately billable procedures and treating other patients.  Critical care  was necessary to treat or prevent imminent or life-threatening deterioration.  Critical care was time spent personally by me on the following activities: development of treatment plan with patient and/or surrogate as well as nursing, discussions with consultants, evaluation of patient's response to treatment, examination of patient, obtaining history from patient or surrogate, ordering and performing treatments and interventions, ordering and review of laboratory studies, ordering and review of radiographic studies, pulse oximetry and re-evaluation of patient's condition.  Final Clinical Impressions(s) / ED Diagnoses   Final diagnoses:  TIA (transient ischemic attack)  Hyponatremia    ED Discharge Orders    None       Jacalyn Lefevre, MD 05/15/17 1429

## 2017-05-15 NOTE — ED Triage Notes (Signed)
Per EMS: pt from independent living at [place]. States that she has had urinary retention x >1 month.  VSS.  States that they were called out for hypoglycemia-facility 22 CBG reading.  On arrival EMS got 127.

## 2017-05-16 ENCOUNTER — Observation Stay (HOSPITAL_BASED_OUTPATIENT_CLINIC_OR_DEPARTMENT_OTHER): Payer: Medicare Other

## 2017-05-16 DIAGNOSIS — E039 Hypothyroidism, unspecified: Secondary | ICD-10-CM | POA: Diagnosis not present

## 2017-05-16 DIAGNOSIS — I10 Essential (primary) hypertension: Secondary | ICD-10-CM

## 2017-05-16 DIAGNOSIS — G459 Transient cerebral ischemic attack, unspecified: Secondary | ICD-10-CM | POA: Diagnosis not present

## 2017-05-16 DIAGNOSIS — E11649 Type 2 diabetes mellitus with hypoglycemia without coma: Secondary | ICD-10-CM

## 2017-05-16 DIAGNOSIS — I340 Nonrheumatic mitral (valve) insufficiency: Secondary | ICD-10-CM

## 2017-05-16 DIAGNOSIS — E871 Hypo-osmolality and hyponatremia: Secondary | ICD-10-CM

## 2017-05-16 DIAGNOSIS — E119 Type 2 diabetes mellitus without complications: Secondary | ICD-10-CM | POA: Diagnosis not present

## 2017-05-16 LAB — ECHOCARDIOGRAM COMPLETE
Height: 64 in
Weight: 2720 oz

## 2017-05-16 LAB — GLUCOSE, CAPILLARY
GLUCOSE-CAPILLARY: 118 mg/dL — AB (ref 65–99)
Glucose-Capillary: 208 mg/dL — ABNORMAL HIGH (ref 65–99)

## 2017-05-16 LAB — HEMOGLOBIN A1C
Hgb A1c MFr Bld: 8 % — ABNORMAL HIGH (ref 4.8–5.6)
Mean Plasma Glucose: 182.9 mg/dL

## 2017-05-16 LAB — CBG MONITORING, ED
GLUCOSE-CAPILLARY: 195 mg/dL — AB (ref 65–99)
Glucose-Capillary: 133 mg/dL — ABNORMAL HIGH (ref 65–99)

## 2017-05-16 LAB — OSMOLALITY: Osmolality: 285 mOsm/kg (ref 275–295)

## 2017-05-16 LAB — LIPID PANEL
Cholesterol: 105 mg/dL (ref 0–200)
HDL: 35 mg/dL — ABNORMAL LOW (ref >40)
LDL CALC: 59 mg/dL (ref 0–99)
Total CHOL/HDL Ratio: 3 RATIO
Triglycerides: 53 mg/dL (ref <150)
VLDL: 11 mg/dL (ref 0–40)

## 2017-05-16 LAB — TROPONIN I
TROPONIN I: 0.03 ng/mL — AB (ref <0.03)
TROPONIN I: 0.03 ng/mL — AB (ref <0.03)
TROPONIN I: 0.03 ng/mL — AB (ref <0.03)

## 2017-05-16 MED ORDER — DIAZEPAM 2 MG PO TABS
1.0000 mg | ORAL_TABLET | Freq: Four times a day (QID) | ORAL | Status: DC | PRN
  Administered 2017-05-17: 1 mg via ORAL
  Filled 2017-05-16: qty 1

## 2017-05-16 MED ORDER — INSULIN ASPART 100 UNIT/ML ~~LOC~~ SOLN
0.0000 [IU] | Freq: Three times a day (TID) | SUBCUTANEOUS | Status: DC
Start: 2017-05-16 — End: 2017-05-20
  Administered 2017-05-16 – 2017-05-17 (×2): 2 [IU] via SUBCUTANEOUS
  Administered 2017-05-17 – 2017-05-18 (×2): 1 [IU] via SUBCUTANEOUS
  Administered 2017-05-18: 2 [IU] via SUBCUTANEOUS
  Administered 2017-05-19: 5 [IU] via SUBCUTANEOUS
  Administered 2017-05-19: 2 [IU] via SUBCUTANEOUS
  Administered 2017-05-20: 3 [IU] via SUBCUTANEOUS
  Administered 2017-05-20: 2 [IU] via SUBCUTANEOUS
  Filled 2017-05-16: qty 1

## 2017-05-16 NOTE — Progress Notes (Signed)
*  PRELIMINARY RESULTS* Vascular Ultrasound Carotid Duplex (Doppler) has been completed.   Findings suggest 1-39% internal carotid artery stenosis bilaterally. Vertebral arteries are patent with antegrade flow.  05/16/2017 10:55 AM Gertie Fey, BS, RVT, RDCS, RDMS

## 2017-05-16 NOTE — Evaluation (Addendum)
Occupational Therapy Evaluation Patient Details Name: Debbie Ortiz MRN: [id number] DOB: 12/27/1923 Today's Date: 05/16/2017    History of Present Illness 84 y.o. female with medical history significant for CHF unknown subtype, hypertension, hypothyroidism, diabetes on oral agents. Patient was seen in the ER on 4/26 after a fall with no significant abnormalities detected so she was sent back to the nursing facility.  On 4/28 patient was discovered with a CBG of 22 and EMS was called to the facility.  Prior to their arrival patient had been given orange juice to drink and follow-up CBG was 127.  She was brought to [facility] further evaluation.  Prior to return to facility patient developed slurred speech and left-sided weakness.  Telemetry neurology was consulted and by the time they were able to evaluate the patient her neurological symptoms had resolved.  Recommended TIA evaluation (including MRI); MRI revealed C3-4 degenerative cord compressio   Clinical Impression   This 84 y/o female presents with the above. Difficult to obtain PLOF as pt providing inconsistent responses; per chart review pt was residing in ALF, pt reports using RW for functional mobility. Pt currently requiring MinA (+2 for safety) for functional mobility using RW within room; requires ModA for LB ADLs. Pt presenting with generalized weakness and cognitive impairments, requiring cues for redirection and for safety during session. Pt will benefit from continued OT services while in acute setting and recommend SNF level OT services after discharge to maximize her overall safety and indpendence with ADLs and mobility.     Follow Up Recommendations  SNF;Supervision/Assistance - 24 hour    Equipment Recommendations  Other (comment)(TBD in next venue )           Precautions / Restrictions Precautions Precautions: Fall Restrictions Weight Bearing Restrictions: No      Mobility Bed Mobility Overal bed mobility: Needs  Assistance Bed Mobility: Supine to Sit     Supine to sit: HOB elevated;Min guard     General bed mobility comments: HOB slightly elevated; minguard for safety, increased time and effort   Transfers Overall transfer level: Needs assistance Equipment used: Rolling walker (2 wheeled) Transfers: Sit to/from Stand Sit to Stand: Min assist;+2 physical assistance;+2 safety/equipment         General transfer comment: assist to rise and steady at RW; VCs for safe hand placement     Balance Overall balance assessment: Needs assistance Sitting-balance support: Feet supported Sitting balance-Leahy Scale: Fair     Standing balance support: Bilateral upper extremity supported;Single extremity supported;During functional activity Standing balance-Leahy Scale: Poor Standing balance comment: reliant on UE support/external support from therapist                            ADL either performed or assessed with clinical judgement   ADL Overall ADL's : Needs assistance/impaired Eating/Feeding: Modified independent   Grooming: Oral care;Wash/dry hands;Minimal assistance;Standing   Upper Body Bathing: Min guard;Sitting   Lower Body Bathing: Minimal assistance;+2 for physical assistance;+2 for safety/equipment;Sit to/from stand   Upper Body Dressing : Minimal assistance;Sitting   Lower Body Dressing: Moderate assistance;+2 for physical assistance;+2 for safety/equipment;Sit to/from stand   Toilet Transfer: Minimal assistance;+2 for safety/equipment;Ambulation;BSC;RW Toilet Transfer Details (indicate cue type and reason): simulated in transfer to recliner  Toileting- Clothing Manipulation and Hygiene: Minimal assistance;+2 for physical assistance;+2 for safety/equipment;Sit to/from stand       Functional mobility during ADLs: Minimal assistance;+2 for physical assistance;+2 for safety/equipment;Rolling walker General ADL Comments:  pt completing functional mobility within room  around EOB to sink for standing grooming ADLs prior to transfer to recliner                          Pertinent Vitals/Pain Pain Assessment: Faces Faces Pain Scale: Hurts a little bit Pain Location: R knee  Pain Descriptors / Indicators: Sore          Extremity/Trunk Assessment Upper Extremity Assessment Upper Extremity Assessment: Generalized weakness   Lower Extremity Assessment Lower Extremity Assessment: Defer to PT evaluation       Communication Communication Communication: No difficulties   Cognition Arousal/Alertness: Awake/alert Behavior During Therapy: WFL for tasks assessed/performed Overall Cognitive Status: No family/caregiver present to determine baseline cognitive functioning Area of Impairment: Orientation;Attention;Memory;Safety/judgement                 Orientation Level: Place Current Attention Level: Sustained Memory: Decreased short-term memory   Safety/Judgement: Decreased awareness of safety     General Comments: pt aware she is in the hospital; demonstrates difficulty attending to task at hand requiring redirection throughout session; difficult to obtain information regarding PLOF as pt providing inconsistent responses    General Comments  VSS                Home Living Family/patient expects to be discharged to:: Assisted living                             Home Equipment: Walker - 2 wheels          Prior Functioning/Environment Level of Independence: Needs assistance  Gait / Transfers Assistance Needed: pt reports she was using RW for ambulation  ADL's / Homemaking Assistance Needed: unsure of assistance provided; pt poor historian during questions and having difficulty clarifying responses    Comments: difficult to fully assess due to noted cognitive deficits        OT Problem List: Decreased strength;Impaired balance (sitting and/or standing);Decreased cognition;Decreased range of motion;Decreased  activity tolerance;Decreased knowledge of use of DME or AE      OT Treatment/Interventions: Self-care/ADL training;DME and/or AE instruction;Therapeutic activities;Balance training;Therapeutic exercise;Patient/family education    OT Goals(Current goals can be found in the care plan section) Acute Rehab OT Goals Patient Stated Goal: to get up and moving  OT Goal Formulation: With patient Time For Goal Achievement: 05/30/17 Potential to Achieve Goals: Good ADL Goals Pt Will Perform Grooming: with supervision;standing Pt Will Perform Upper Body Bathing: with supervision;sitting Pt Will Perform Lower Body Bathing: with min guard assist;sit to/from stand Pt Will Perform Lower Body Dressing: with min guard assist;sit to/from stand Pt Will Transfer to Toilet: with min guard assist;ambulating;bedside commode(BSC over toilet) Pt Will Perform Toileting - Clothing Manipulation and hygiene: with min guard assist;sit to/from stand  OT Frequency: Min 2X/week               Co-evaluation PT/OT/SLP Co-Evaluation/Treatment: Yes Reason for Co-Treatment: For patient/therapist safety;To address functional/ADL transfers   OT goals addressed during session: ADL's and self-care;Proper use of Adaptive equipment and DME      AM-PAC PT "6 Clicks" Daily Activity     Outcome Measure Help from another person eating meals?: None Help from another person taking care of personal grooming?: A Little Help from another person toileting, which includes using toliet, bedpan, or urinal?: A Little Help from another person bathing (including washing, rinsing, drying)?: A Little Help from another  person to put on and taking off regular upper body clothing?: A Little Help from another person to put on and taking off regular lower body clothing?: A Little 6 Click Score: 19   End of Session Equipment Utilized During Treatment: Gait belt;Rolling walker Nurse Communication: Mobility status  Activity Tolerance: Patient  tolerated treatment well Patient left: with call bell/phone within reach;in chair;with chair alarm set  OT Visit Diagnosis: Muscle weakness (generalized) (M62.81);Unsteadiness on feet (R26.81)                Time: 1610-9604 OT Time Calculation (min): 25 min Charges:  OT General Charges $OT Visit: 1 Visit OT Evaluation $OT Eval Moderate Complexity: 1 Mod G-Codes:     Marcy Siren, OT Pager [phone] 05/16/2017   Debbie Ortiz 05/16/2017, 11:14 AM

## 2017-05-16 NOTE — Evaluation (Signed)
Physical Therapy Evaluation Patient Details Name: Debbie Ortiz MRN: [id number] DOB: [birth year] Today's Date: 05/16/2017   History of Present Illness  84 y.o. female with medical history significant for CHF unknown subtype, hypertension, hypothyroidism, diabetes on oral agents. Patient was seen in the ER on 4/26 after a fall with no significant abnormalities detected so she was sent back to the nursing facility.  On 4/28 patient was discovered with a CBG of 22 and EMS was called to the facility.  Prior to their arrival patient had been given orange juice to drink and follow-up CBG was 127.  She was brought to [facility] further evaluation.  Prior to return to facility patient developed slurred speech and left-sided weakness.  Telemetry neurology was consulted and by the time they were able to evaluate the patient her neurological symptoms had resolved.  Recommended TIA evaluation (including MRI); MRI revealed C3-4 degenerative cord compressio  Clinical Impression  Pt was at home and sustained a fall, along with recent fall on 3.29 for issues of syncope and not being found for a day or so.  Her plan is to progress acute therapy as tolerated with mobility of gait and transfers, strengthening as tolerated, and then progress to SNF to reduce her fall risk at home.  If pt is not able to progress well there then recommend she stay permanently.    Follow Up Recommendations SNF    Equipment Recommendations  Rolling walker with 5" wheels    Recommendations for Other Services       Precautions / Restrictions Precautions Precautions: Fall Precaution Comments: Pt was placed on bed pad to sit up in chair Restrictions Weight Bearing Restrictions: No      Mobility  Bed Mobility Overal bed mobility: Needs Assistance Bed Mobility: Supine to Sit     Supine to sit: HOB elevated;Min assist     General bed mobility comments: HOB slightly elevated; minguard for safety, increased time and effort    Transfers Overall transfer level: Needs assistance Equipment used: Rolling walker (2 wheeled) Transfers: Sit to/from Stand Sit to Stand: Min assist;+2 physical assistance;+2 safety/equipment         General transfer comment: assist to rise and steady at RW; VCs for safe hand placement   Ambulation/Gait Ambulation/Gait assistance: Min assist;Min guard Ambulation Distance (Feet): 40 Feet(25+15) Assistive device: Rolling walker (2 wheeled);1 person hand held assist Gait Pattern/deviations: Step-through pattern;Decreased stride length;Shuffle;Wide base of support Gait velocity: reduced Gait velocity interpretation: <1.8 ft/sec, indicate of risk for recurrent falls General Gait Details: struggling with hip ext tightness that has her standing in a squat  Stairs            Wheelchair Mobility    Modified Rankin (Stroke Patients Only)       Balance Overall balance assessment: Needs assistance Sitting-balance support: Feet supported Sitting balance-Leahy Scale: Fair     Standing balance support: Bilateral upper extremity supported;During functional activity Standing balance-Leahy Scale: Poor Standing balance comment: requires RW for UE support but also is feeling unsafe and requiring HHA support                             Pertinent Vitals/Pain Pain Assessment: No/denies pain Faces Pain Scale: Hurts little more Pain Location: R knee  Pain Descriptors / Indicators: Sore    Home Living Family/patient expects to be discharged to:: Assisted living               Home  Equipment: Dan Humphreys - 2 wheels      Prior Function Level of Independence: Needs assistance   Gait / Transfers Assistance Needed: RW to ambulate at home  ADL's / Homemaking Assistance Needed: Pt is unsure that she used help to walk  Comments: difficult to fully assess due to noted cognitive deficits     Hand Dominance        Extremity/Trunk Assessment   Upper Extremity  Assessment Upper Extremity Assessment: Generalized weakness    Lower Extremity Assessment Lower Extremity Assessment: Generalized weakness    Cervical / Trunk Assessment Cervical / Trunk Assessment: Kyphotic  Communication   Communication: No difficulties  Cognition Arousal/Alertness: Awake/alert Behavior During Therapy: WFL for tasks assessed/performed Overall Cognitive Status: No family/caregiver present to determine baseline cognitive functioning Area of Impairment: Orientation;Attention;Memory;Safety/judgement                 Orientation Level: Place;Situation Current Attention Level: Selective Memory: Decreased recall of precautions;Decreased short-term memory   Safety/Judgement: Decreased awareness of safety;Decreased awareness of deficits     General Comments: Pt is struggling to answer questions with O2      General Comments General comments (skin integrity, edema, etc.): stable O2 sats and pulses    Exercises     Assessment/Plan    PT Assessment Patient needs continued PT services  PT Problem List Decreased range of motion;Decreased strength;Decreased activity tolerance;Decreased balance;Decreased mobility;Decreased coordination;Decreased knowledge of use of DME;Decreased safety awareness;Decreased knowledge of precautions;Cardiopulmonary status limiting activity;Obesity       PT Treatment Interventions DME instruction;Gait training;Stair training;Functional mobility training;Therapeutic activities;Therapeutic exercise;Balance training;Neuromuscular re-education;Patient/family education    PT Goals (Current goals can be found in the Care Plan section)  Acute Rehab PT Goals Patient Stated Goal: to be able to walk PT Goal Formulation: With patient Time For Goal Achievement: 05/30/17 Potential to Achieve Goals: Good    Frequency Min 2X/week   Barriers to discharge Inaccessible home environment;Decreased caregiver support(home alone)       Co-evaluation PT/OT/SLP Co-Evaluation/Treatment: Yes Reason for Co-Treatment: Complexity of the patient's impairments (multi-system involvement);For patient/therapist safety PT goals addressed during session: Mobility/safety with mobility;Balance;Proper use of DME         AM-PAC PT "6 Clicks" Daily Activity  Outcome Measure Difficulty turning over in bed (including adjusting bedclothes, sheets and blankets)?: Unable Difficulty moving from lying on back to sitting on the side of the bed? : Unable Difficulty sitting down on and standing up from a chair with arms (e.g., wheelchair, bedside commode, etc,.)?: Unable Help needed moving to and from a bed to chair (including a wheelchair)?: A Little Help needed walking in hospital room?: A Little Help needed climbing 3-5 steps with a railing? : Total 6 Click Score: 10    End of Session Equipment Utilized During Treatment: Gait belt Activity Tolerance: Patient tolerated treatment well;Patient limited by fatigue;Treatment limited secondary to medical complications (Comment) Patient left: in chair;with call bell/phone within reach;with chair alarm set Nurse Communication: Mobility status PT Visit Diagnosis: Unsteadiness on feet (R26.81);Muscle weakness (generalized) (M62.81);History of falling (Z91.81);Adult, failure to thrive (R62.7)    Time: 1610-9604 PT Time Calculation (min) (ACUTE ONLY): 25 min   Charges:   PT Evaluation $PT Eval Moderate Complexity: 1 Mod     PT G Codes:   PT G-Codes **NOT FOR INPATIENT CLASS** Functional Assessment Tool Used: AM-PAC 6 Clicks Basic Mobility    Ivar Drape 05/16/2017, 6:24 PM   Samul Dada, PT MS Acute Rehab Dept. Number: [phone] and [facility]  [phone]    

## 2017-05-16 NOTE — Progress Notes (Signed)
Debbie Ortiz [id number] Admission Data: 05/16/2017 6:25 PM Attending Provider: Joseph Art, DO  NWG:[id number], No Pcp Per Consults/ Treatment Team:   Debbie Ortiz is a 84 y.o. female patient admitted from ED awake, alert  & orientated  X 3,  Full Code, VSS - Blood pressure (!) 133/58, pulse (!) 50, temperature (!) 97.5 F (36.4 C), resp. rate 16, height  (1.626 m), weight 77.1 kg (170 lb), SpO2 100 %., , no c/o shortness of breath, no c/o chest pain, no distress noted. Tele placed and pt is currently running:sinus bradycardia.   IV site WDL:  wrist right, condition patent and no redness with a transparent dsg that's clean dry and intact.  Allergies:   Allergies  Allergen Reactions  . Codeine Other (See Comments)    Reaction (??)  . Sulfa Antibiotics Other (See Comments)    Reaction (??)  . Tape Other (See Comments)    Per family, patient tolerates Coban wrap well     Past Medical History:  Diagnosis Date  . Arthritis   . CHF (congestive heart failure) (HCC)   . Diabetes mellitus without complication (HCC)   . Hypertension   . Thyroid disease     History:  obtained from chart review. Tobacco/alcohol: unknown tobacco use   Pt orientation to unit, room and routine. Information packet given to patient/family and safety video watched.  Admission INP armband ID verified with patient/family, and in place. SR up x 2, fall risk assessment complete with Patient and family verbalizing understanding of risks associated with falls. Pt verbalizes an understanding of how to use the call bell and to call for help before getting out of bed.  Skin, clean-dry- intact without evidence of bruising, or skin tears.   No evidence of skin break down noted on exam.     Will cont to monitor and assist as needed.  Lorene Dy, RN 05/16/2017 6:25 PM

## 2017-05-16 NOTE — Progress Notes (Signed)
Inpatient Diabetes Program Recommendations  AACE/ADA: New Consensus Statement on Inpatient Glycemic Control (2019)  Target Ranges:  Prepandial:   less than 140 mg/dL      Peak postprandial:   less than 180 mg/dL (1-2 hours)      Critically ill patients:  140 - 180 mg/dL   Results for VOLANDA, MANGINE (MRN [id number]) as of 05/16/2017 10:39  Ref. Range 05/13/2017 18:08 05/15/2017 10:51 05/15/2017 15:10 05/15/2017 21:20 05/16/2017 08:22  Glucose-Capillary Latest Ref Range: 65 - 99 mg/dL 66 409 (H) 811 (H) 914 (H) 133 (H)   Review of Glycemic Control  Diabetes history: DM2 Outpatient Diabetes medications: Glipizide 5 mg QAM, Metformin 500 mg BID Current orders for Inpatient glycemic control: CBGs  Inpatient Diabetes Program Recommendations: Correction (SSI): While inpatient, please consider ordering Novolog 0-9 units TID with meals.  Thanks, Orlando Penner, RN, MSN, CDE Diabetes Coordinator Inpatient Diabetes Program [phone] (Team Pager from 8am to 5pm)

## 2017-05-16 NOTE — ED Notes (Signed)
Pt awakened and called out for help. Disoriented to place and situation. Reoriented and pt returned to [facility].  Pt states she just "had been resting so well" she woke up and didn't realize where she was.

## 2017-05-16 NOTE — ED Notes (Signed)
[facility] called and stated they are coming to get pt.

## 2017-05-16 NOTE — Progress Notes (Signed)
Echocardiogram 2D Echocardiogram has been performed.  05/16/2017 10:53 AM Gertie Fey, BS, RVT, RDCS, RDMS

## 2017-05-16 NOTE — ED Notes (Signed)
Pt yelled out from room. Awoke disoriented to place and situation.  Pt reoriented, awoke further and became alert, assisted to restroom and returned to bed.  Pt alert and oriented at that time.

## 2017-05-16 NOTE — ED Notes (Signed)
Pt ambulated to toilet with walker and standby assist 

## 2017-05-16 NOTE — Progress Notes (Signed)
PROGRESS NOTE    Debbie Ortiz  ZOX:[id number] DOB: Jun 17, 1923 DOA: 05/15/2017 PCP: Patient, No Pcp Per   Outpatient Specialists:    Brief Narrative:  Debbie Ortiz is a 84 y.o. female with medical history significant for CHF unknown subtype, hypertension, hypothyroidism, diabetes on oral agents.  Patient with recent issues regarding urinary retention was negative for infectious etiology.  Patient was seen in the ER on 4/26 after a fall with no significant abnormalities detected so she was sent back to the nursing facility.  This morning patient was discovered with a CBG of 22 and EMS was called to the facility.  Prior to their arrival patient had been given orange juice to drink and follow-up CBG was 127.  She was brought to [facility] further evaluation.  No significant abnormalities were found.  Patient had distended abdomen.  She was able to go to the bathroom and passed urine and flatus and symptoms improved and plan was to discharge patient back to facility.  In route to room from bathroom patient developed slurred speech and left-sided weakness.  Telemetry neurology was consulted and by the time they were able to evaluate the patient her neurological symptoms had resolved.  Recommendation was to transport patient to [facility] for TIA evaluation (including MRI).     Assessment & Plan:   Principal Problem:   TIA (transient ischemic attack) Active Problems:   Acute hyponatremia   Hypertension   Hypothyroidism   History of CHF (congestive heart failure)   History of urinary retention   Diabetes mellitus without complication (HCC)   Elevated troponin    TIA (transient ischemic attack)/pre-syncope -Patient presented in context of recent hypoglycemia and developed transient slurred speech and left-sided weakness which has completely resolved -MRI brain negative for -Echocardiogram/carotid duplex -HgbA1c/FLP -PT/OT/SLP evaluation -asa/plavix per neuro -Evaluated by tele-neurologist at [facility]-  discussed with neuro here    Diabetes mellitus without complication  -Recent issues with hypoglycemia that responded to orange juice at facility -Hold preadmission metformin and Glucotrol -SSI-- may not need glucotrol when d/c'd due to hypoglycemia -Carb modified diet    Elevated troponin -flat No chest pain    Acute hyponatremia  -Likely related to preadmission utilization of both Lasix and Aldactone -Has received normal saline IV fluid since arrival to ER -BMP in AM    Hypertension -Patient on diuretics as well as ARB and beta-blocker prior to admission -Continue ARB and beta-blocker    History of CHF (congestive heart failure) -Subtype unknown -Prior to admission patient on Aldactone, twice daily Lasix, low-dose beta-blocker and high-dose ARB -Daily weights, strict I's/O -No prior echo available -Follow-up on echocardiogram    History of urinary retention -Documented with recent issues regarding urinary retention -bladder scan hourly -Suspect patient may have underlying neurogenic bladder    Hypothyroidism -Continue Synthroid  Dementia -discussed with family- recommend palliative care consult for GOC and avoidance of future hospitalization -1-2 years ago live on own in [place] and has had fast worsening since then   DVT prophylaxis:  Lovenox   Code Status: Full Code   Family Communication: Spoke with  Nephew, MPOA  Disposition Plan:  From SNF  Consultants:  Neuro (tele) Palliative care  Subjective: Overnight had worsening confusion  Objective: Vitals:   05/16/17 0815 05/16/17 0915 05/16/17 0930 05/16/17 0945  BP: 106/83 (!) 142/47 (!) 114/50 (!) 110/46  Pulse: 66 63 (!) 59 (!) 57  Resp: Temp:      TempSrc:  SpO2: 99% 100% 98% 98%  Weight:      Height:        Intake/Output Summary (Last 24 hours) at 05/16/2017 1252 Last data filed at 05/15/2017 2027 Gross per 24 hour  Intake 500 ml  Output 600 ml  Net -100 ml    Filed Weights   05/15/17 0902 05/15/17 0903  Weight: 77.1 kg (170 lb) 77.1 kg (170 lb)    Examination:  General exam: up working with PT/OT Respiratory system: Clear to auscultation. Respiratory effort normal. Cardiovascular system: S1 & S2 heard, RRR. No JVD, murmurs, rubs, gallops or clicks. No pedal edema. Gastrointestinal system: Abdomen is nondistended, soft and nontender. No organomegaly or masses felt. Normal bowel sounds heard. Central nervous system: Alert but confused    Data Reviewed: I have personally reviewed following labs and imaging studies  CBC: Recent Labs  Lab 05/13/17 1626 05/15/17 1122  WBC 7.3 5.7  NEUTROABS 4.4 2.5  HGB 11.6* 11.9*  HCT 33.8* 34.4*  MCV 77.5* 77.0*  PLT 384 392   Basic Metabolic Panel: Recent Labs  Lab 05/13/17 1626 05/15/17 1122  NA 128* 128*  K 4.5 4.5  CL 93* 96*  CO2 19* 21*  GLUCOSE 59* 120*  BUN 16 17  CREATININE 1.00 0.88  CALCIUM 9.7 9.3   GFR: Estimated Creatinine Clearance: 41 mL/min (by C-G formula based on SCr of 0.88 mg/dL). Liver Function Tests: Recent Labs  Lab 05/13/17 1626 05/15/17 1122  AST 37 38  ALT 25 26  ALKPHOS 306* 292*  BILITOT 0.6 0.4  PROT 8.0 8.5*  ALBUMIN 3.4* 3.7   No results for input(s): LIPASE, AMYLASE in the last 168 hours. No results for input(s): AMMONIA in the last 168 hours. Coagulation Profile: No results for input(s): INR, PROTIME in the last 168 hours. Cardiac Enzymes: Recent Labs  Lab 05/15/17 1122 05/16/17 0253 05/16/17 0828  TROPONINI 0.06* 0.03* 0.03*   BNP (last 3 results) No results for input(s): PROBNP in the last 8760 hours. HbA1C: Recent Labs    05/16/17 0253  HGBA1C 8.0*   CBG: Recent Labs  Lab 05/15/17 1051 05/15/17 1510 05/15/17 2120 05/16/17 0822 05/16/17 1148  GLUCAP 116* 146* 302* 133* 195*   Lipid Profile: Recent Labs    05/16/17 0828  CHOL 105  HDL 35*  LDLCALC 59  TRIG 53  CHOLHDL 3.0   Thyroid Function Tests: No  results for input(s): TSH, T4TOTAL, FREET4, T3FREE, THYROIDAB in the last 72 hours. Anemia Panel: No results for input(s): VITAMINB12, FOLATE, FERRITIN, TIBC, IRON, RETICCTPCT in the last 72 hours. Urine analysis:    Component Value Date/Time   COLORURINE YELLOW 05/15/2017 1011   APPEARANCEUR CLEAR 05/15/2017 1011   LABSPEC <1.005 (L) 05/15/2017 1011   PHURINE 6.0 05/15/2017 1011   GLUCOSEU NEGATIVE 05/15/2017 1011   HGBUR NEGATIVE 05/15/2017 1011   BILIRUBINUR NEGATIVE 05/15/2017 1011   KETONESUR NEGATIVE 05/15/2017 1011   PROTEINUR NEGATIVE 05/15/2017 1011   NITRITE NEGATIVE 05/15/2017 1011   LEUKOCYTESUR NEGATIVE 05/15/2017 1011     Recent Results (from the past 240 hour(s))  Urine culture     Status: Abnormal   Collection Time: 05/13/17  3:20 PM  Result Value Ref Range Status   Specimen Description URINE, RANDOM  Final   Special Requests NONE  Final   Culture (A)  Final    <10,000 COLONIES/mL INSIGNIFICANT GROWTH Performed at [facility], [address], [place], [place] [address]    Report Status 05/14/2017 FINAL  Final      Anti-infectives (From admission, onward)   None       Radiology Studies: Mr Brain Wo Contrast  Result Date: 05/15/2017 CLINICAL DATA:  Hypoglycemia this morning. Transient slurred speech and left-sided weakness. EXAM: MRI HEAD WITHOUT CONTRAST MRA HEAD WITHOUT CONTRAST TECHNIQUE: Multiplanar, multiecho pulse sequences of the brain and surrounding structures were obtained without intravenous contrast. Angiographic images of the head were obtained using MRA technique without contrast. COMPARISON:  Head CT earlier today FINDINGS: MRI HEAD FINDINGS Brain: No acute infarction, hemorrhage, hydrocephalus, extra-axial collection or mass lesion. Cerebral atrophy. Mild chronic small vessel ischemia for age. FLAIR signal around the lower brainstem is attributed to flow artifact. Vascular: Arterial findings below. Normal dural venous sinus flow voids  Skull and upper cervical spine: No evidence of marrow lesion. C3-4 disc degeneration with calcified disc protrusion on recent CT cervical spine. There is ligamentum flavum thickening at the same level with cord flattening. Hyperostosis interna. Sinuses/Orbits: Bilateral cataract resection. Essentially clear sinuses. MRA HEAD FINDINGS Symmetric carotid and vertebral arteries. Dominant left PICA and right AICA. No flow limiting stenosis in the anterior circulation. There is high-grade distal left P2 segment narrowing with less robust signal in the downstream vessel compared to the right. No beading or aneurysm. IMPRESSION: Brain MRI: 1. Senescent changes without acute finding.  No acute infarct. 2. C3-4 degenerative cord compression. Intracranial MRA: 1. High-grade distal left P2 segment stenosis. 2. Otherwise, negative. Electronically Signed   By: Marnee Spring M.D.   On: 05/15/2017 19:51   Dg Abdomen Acute W/chest  Result Date: 05/15/2017 CLINICAL DATA:  Constipation.  Distention. EXAM: DG ABDOMEN ACUTE W/ 1V CHEST COMPARISON:  Chest x-ray May 13, 2017 FINDINGS: Mild prominence of soft tissue to the right of the trachea is probably ectatic vasculature. This is unchanged since April 15, 2017. The heart size borderline. The hila and mediastinum are unchanged. No pneumothorax. No nodules or masses. No focal infiltrates. No free air, portal venous gas, or pneumatosis. The bowel gas pattern is normal with no evidence of obstruction. Vascular calcifications are noted. No other acute abnormalities. IMPRESSION: 1. No acute abnormality to explain the patient's symptoms. Electronically Signed   By: Gerome Sam III M.D   On: 05/15/2017 10:57   Mr Maxine Glenn Head Wo Contrast  Result Date: 05/15/2017 CLINICAL DATA:  Hypoglycemia this morning. Transient slurred speech and left-sided weakness. EXAM: MRI HEAD WITHOUT CONTRAST MRA HEAD WITHOUT CONTRAST TECHNIQUE: Multiplanar, multiecho pulse sequences of the brain and  surrounding structures were obtained without intravenous contrast. Angiographic images of the head were obtained using MRA technique without contrast. COMPARISON:  Head CT earlier today FINDINGS: MRI HEAD FINDINGS Brain: No acute infarction, hemorrhage, hydrocephalus, extra-axial collection or mass lesion. Cerebral atrophy. Mild chronic small vessel ischemia for age. FLAIR signal around the lower brainstem is attributed to flow artifact. Vascular: Arterial findings below. Normal dural venous sinus flow voids Skull and upper cervical spine: No evidence of marrow lesion. C3-4 disc degeneration with calcified disc protrusion on recent CT cervical spine. There is ligamentum flavum thickening at the same level with cord flattening. Hyperostosis interna. Sinuses/Orbits: Bilateral cataract resection. Essentially clear sinuses. MRA HEAD FINDINGS Symmetric carotid and vertebral arteries. Dominant left PICA and right AICA. No flow limiting stenosis in the anterior circulation. There is high-grade distal left P2 segment narrowing with less robust signal in the downstream vessel compared to the right. No beading or aneurysm. IMPRESSION: Brain MRI: 1. Senescent changes without acute finding.  No acute  infarct. 2. C3-4 degenerative cord compression. Intracranial MRA: 1. High-grade distal left P2 segment stenosis. 2. Otherwise, negative. Electronically Signed   By: Marnee Spring M.D.   On: 05/15/2017 19:51   Ct Head Code Stroke Wo Contrast  Addendum Date: 05/15/2017   ADDENDUM REPORT: 05/15/2017 11:50 ADDENDUM: These results were called by telephone at the time of interpretation on 05/15/2017 at 11:50 am to Dr. Jacalyn Lefevre , who verbally acknowledged these results. Electronically Signed   By: Elsie Stain M.D.   On: 05/15/2017 11:50   Result Date: 05/15/2017 CLINICAL DATA:  Code stroke.  Sudden onset of altered mental status. EXAM: CT HEAD WITHOUT CONTRAST TECHNIQUE: Contiguous axial images were obtained from the base  of the skull through the vertex without intravenous contrast. COMPARISON:  05/13/2017. FINDINGS: Brain: No evidence for acute infarction, hemorrhage, mass lesion, hydrocephalus, or extra-axial fluid. Mild atrophy. Hypoattenuation of white matter, consistent with small vessel disease. Vascular: Calcification of the cavernous internal carotid arteries consistent with cerebrovascular atherosclerotic disease. No signs of intracranial large vessel occlusion. Skull: No fracture.  Marked hyperostosis. Sinuses/Orbits: No acute sinus disease. No orbital findings of significance. Other: None. ASPECTS [facility] Stroke Program Early CT Score) - Ganglionic level infarction (caudate, lentiform nuclei, internal capsule, insula, M1-M3 cortex): 7 - Supraganglionic infarction (M4-M6 cortex): 3 Total score (0-10 with 10 being normal): 10 IMPRESSION: 1. Mild atrophy and small vessel disease, unchanged from recent priors. No acute stroke is evident. 2. ASPECTS is 10. These results were communicated to Dr. Laurence Slate at 11:40 amon 4/28/2019by text page via the [facility] messaging system. Electronically Signed: By: Elsie Stain M.D. On: 05/15/2017 11:41        Scheduled Meds: .  stroke: mapping our early stages of recovery book   Does not apply Once  . aspirin  81 mg Oral Daily  . clopidogrel  75 mg Oral Daily  . enoxaparin (LOVENOX) injection  40 mg Subcutaneous Q24H  . insulin aspart  0-9 Units Subcutaneous TID WC  . levothyroxine  75 mcg Oral QAC breakfast  . losartan  100 mg Oral Daily  . metoprolol tartrate  25 mg Oral BID  . polyethylene glycol  17 g Oral Daily  . rosuvastatin  20 mg Oral Daily   Continuous Infusions:   LOS: 0 days    Time spent: 35 min    Joseph Art, DO [facility] Pager [phone]  If 7PM-7AM, please contact night-coverage [email or web address] Password [facility] 05/16/2017, 12:52 PM

## 2017-05-17 DIAGNOSIS — R402362 Coma scale, best motor response, obeys commands, at arrival to emergency department: Secondary | ICD-10-CM | POA: Diagnosis present

## 2017-05-17 DIAGNOSIS — I5030 Unspecified diastolic (congestive) heart failure: Secondary | ICD-10-CM | POA: Diagnosis present

## 2017-05-17 DIAGNOSIS — Z8679 Personal history of other diseases of the circulatory system: Secondary | ICD-10-CM | POA: Diagnosis not present

## 2017-05-17 DIAGNOSIS — G8194 Hemiplegia, unspecified affecting left nondominant side: Secondary | ICD-10-CM | POA: Diagnosis present

## 2017-05-17 DIAGNOSIS — Z515 Encounter for palliative care: Secondary | ICD-10-CM | POA: Diagnosis not present

## 2017-05-17 DIAGNOSIS — M1990 Unspecified osteoarthritis, unspecified site: Secondary | ICD-10-CM | POA: Diagnosis present

## 2017-05-17 DIAGNOSIS — Z7902 Long term (current) use of antithrombotics/antiplatelets: Secondary | ICD-10-CM | POA: Diagnosis not present

## 2017-05-17 DIAGNOSIS — Z882 Allergy status to sulfonamides status: Secondary | ICD-10-CM | POA: Diagnosis not present

## 2017-05-17 DIAGNOSIS — R4781 Slurred speech: Secondary | ICD-10-CM | POA: Diagnosis present

## 2017-05-17 DIAGNOSIS — R29707 NIHSS score 7: Secondary | ICD-10-CM | POA: Diagnosis present

## 2017-05-17 DIAGNOSIS — Z66 Do not resuscitate: Secondary | ICD-10-CM

## 2017-05-17 DIAGNOSIS — E119 Type 2 diabetes mellitus without complications: Secondary | ICD-10-CM | POA: Diagnosis not present

## 2017-05-17 DIAGNOSIS — G459 Transient cerebral ischemic attack, unspecified: Secondary | ICD-10-CM | POA: Diagnosis present

## 2017-05-17 DIAGNOSIS — R531 Weakness: Secondary | ICD-10-CM | POA: Diagnosis not present

## 2017-05-17 DIAGNOSIS — R4189 Other symptoms and signs involving cognitive functions and awareness: Secondary | ICD-10-CM | POA: Diagnosis present

## 2017-05-17 DIAGNOSIS — E871 Hypo-osmolality and hyponatremia: Secondary | ICD-10-CM | POA: Diagnosis present

## 2017-05-17 DIAGNOSIS — Z7982 Long term (current) use of aspirin: Secondary | ICD-10-CM | POA: Diagnosis not present

## 2017-05-17 DIAGNOSIS — I471 Supraventricular tachycardia: Secondary | ICD-10-CM | POA: Diagnosis not present

## 2017-05-17 DIAGNOSIS — Z885 Allergy status to narcotic agent status: Secondary | ICD-10-CM | POA: Diagnosis not present

## 2017-05-17 DIAGNOSIS — E11649 Type 2 diabetes mellitus with hypoglycemia without coma: Secondary | ICD-10-CM | POA: Diagnosis present

## 2017-05-17 DIAGNOSIS — I110 Hypertensive heart disease with heart failure: Secondary | ICD-10-CM | POA: Diagnosis present

## 2017-05-17 DIAGNOSIS — E039 Hypothyroidism, unspecified: Secondary | ICD-10-CM | POA: Diagnosis present

## 2017-05-17 DIAGNOSIS — R748 Abnormal levels of other serum enzymes: Secondary | ICD-10-CM | POA: Diagnosis not present

## 2017-05-17 DIAGNOSIS — R402252 Coma scale, best verbal response, oriented, at arrival to emergency department: Secondary | ICD-10-CM | POA: Diagnosis present

## 2017-05-17 DIAGNOSIS — F0390 Unspecified dementia without behavioral disturbance: Secondary | ICD-10-CM | POA: Diagnosis present

## 2017-05-17 DIAGNOSIS — Z79890 Hormone replacement therapy (postmenopausal): Secondary | ICD-10-CM | POA: Diagnosis not present

## 2017-05-17 DIAGNOSIS — Z6829 Body mass index (BMI) 29.0-29.9, adult: Secondary | ICD-10-CM | POA: Diagnosis not present

## 2017-05-17 DIAGNOSIS — R627 Adult failure to thrive: Secondary | ICD-10-CM | POA: Diagnosis present

## 2017-05-17 DIAGNOSIS — R402142 Coma scale, eyes open, spontaneous, at arrival to emergency department: Secondary | ICD-10-CM | POA: Diagnosis present

## 2017-05-17 LAB — MAGNESIUM: Magnesium: 1.7 mg/dL (ref 1.7–2.4)

## 2017-05-17 LAB — BASIC METABOLIC PANEL
ANION GAP: 10 (ref 5–15)
BUN: 11 mg/dL (ref 6–20)
CALCIUM: 9.5 mg/dL (ref 8.9–10.3)
CO2: 23 mmol/L (ref 22–32)
Chloride: 98 mmol/L — ABNORMAL LOW (ref 101–111)
Creatinine, Ser: 0.78 mg/dL (ref 0.44–1.00)
Glucose, Bld: 130 mg/dL — ABNORMAL HIGH (ref 65–99)
POTASSIUM: 4.2 mmol/L (ref 3.5–5.1)
Sodium: 131 mmol/L — ABNORMAL LOW (ref 135–145)

## 2017-05-17 LAB — GLUCOSE, CAPILLARY
GLUCOSE-CAPILLARY: 248 mg/dL — AB (ref 65–99)
Glucose-Capillary: 110 mg/dL — ABNORMAL HIGH (ref 65–99)
Glucose-Capillary: 165 mg/dL — ABNORMAL HIGH (ref 65–99)
Glucose-Capillary: 158 mg/dL — ABNORMAL HIGH (ref 65–99)
Glucose-Capillary: 136 mg/dL — ABNORMAL HIGH (ref 65–99)

## 2017-05-17 LAB — TSH: TSH: 1.429 u[IU]/mL (ref 0.350–4.500)

## 2017-05-17 MED ORDER — HYDRALAZINE HCL 20 MG/ML IJ SOLN
10.0000 mg | Freq: Four times a day (QID) | INTRAMUSCULAR | Status: DC | PRN
  Filled 2017-05-17: qty 1

## 2017-05-17 MED ORDER — CARVEDILOL 3.125 MG PO TABS
3.1250 mg | ORAL_TABLET | Freq: Two times a day (BID) | ORAL | Status: DC
Start: 2017-05-17 — End: 2017-05-18
  Administered 2017-05-17 – 2017-05-18 (×2): 3.125 mg via ORAL
  Filled 2017-05-17 (×2): qty 1

## 2017-05-17 MED ORDER — HYDRALAZINE HCL 10 MG PO TABS
10.0000 mg | ORAL_TABLET | Freq: Three times a day (TID) | ORAL | Status: DC
  Administered 2017-05-17 – 2017-05-18 (×2): 10 mg via ORAL
  Filled 2017-05-17 (×2): qty 1

## 2017-05-17 NOTE — Progress Notes (Addendum)
PROGRESS NOTE    Debbie Ortiz  ZOX:[id number] DOB: 07-18-1923 DOA: 05/15/2017 PCP: Almetta Lovely, [facility]   Outpatient Specialists:    Brief Narrative:  Debbie Ortiz is a 84 y.o. female with medical history significant for CHF unknown subtype, hypertension, hypothyroidism, diabetes on oral agents.  Patient with recent issues regarding urinary retention was negative for infectious etiology.  Patient was seen in the ER on 4/26 after a fall with no significant abnormalities detected so she was sent back to the nursing facility.  This morning patient was discovered with a CBG of 22 and EMS was called to the facility.  Prior to their arrival patient had been given orange juice to drink and follow-up CBG was 127.  She was brought to [facility] further evaluation.  No significant abnormalities were found.  Patient had distended abdomen.  She was able to go to the bathroom and passed urine and flatus and symptoms improved and plan was to discharge patient back to facility.  In route to room from bathroom patient developed slurred speech and left-sided weakness.  Telemetry neurology was consulted and by the time they were able to evaluate the patient her neurological symptoms had resolved.  Recommendation was to transport patient to [facility] for TIA evaluation (including MRI).     Assessment & Plan:   Principal Problem:   TIA (transient ischemic attack) Active Problems:   Acute hyponatremia   Hypertension   Hypothyroidism   History of CHF (congestive heart failure)   History of urinary retention   Diabetes mellitus without complication (HCC)   Elevated troponin   Palliative care by specialist   DNR (do not resuscitate)   Weakness generalized    TIA (transient ischemic attack)/pre-syncope -Patient presented in context of recent hypoglycemia and developed transient slurred speech and left-sided weakness which has completely resolved -MRI brain negative for CVA -Echocardiogram/carotid  duplex -HgbA1c/FLP- done -PT/OT/SLP evaluation-- SNF-- needs higher level of care -asa/plavix per neuro -Evaluated by tele-neurologist at [facility]- discussed with neuro here -check orthostatics    Diabetes mellitus without complication  -Recent issues with hypoglycemia that responded to orange juice at facility -Hold preadmission metformin and Glucotrol -SSI-- may not need glucotrol when d/c'd due to hypoglycemia -Carb modified diet -hgbA1c: 8-- at her age would not try to be stricter with control    Elevated troponin -flat No chest pain    Acute hyponatremia  -Likely related to preadmission utilization of both Lasix and Aldactone -Has received normal saline IV fluid since arrival to ER -improved    Hypertension -Patient on diuretics as well as ARB and beta-blocker prior to admission -Continue ARB and beta-blocker (change and lower dose)    History of CHF (congestive heart failure) -Subtype unknown -Prior to admission patient on Aldactone, twice daily Lasix, low-dose beta-blocker and high-dose ARB -Daily weights, strict I's/O -echo: - Normal LV function; mild diastolic dysfunction; mild LVH with   elevated LV filling pressure; mild to moderate MR.    History of urinary retention -Documented with recent issues regarding urinary retention -Suspect patient may have underlying neurogenic bladder -appears emptying bladder well    Hypothyroidism -Continue Synthroid  Dementia -with behavioral disturbances -needs higher level of care  37 beat run of Vtac per nursing -check Mg -K ok -BB had be stopped for 2 deg HB-- will try lower dose coreg   Had an episode of un-responsiveness this PM-- ? Behavioral tele was normal-- about an hour after, she had a run of v tach-- asymptomatic.  Patient  is not safe to go back to prior living environment-- needs higher level of care. Palliative care to continue following along    DVT prophylaxis:  Lovenox   Code Status: Full  Code   Family Communication: Spoke with  Nephew, MPOA  Disposition Plan:  From ILF?? [facility]  Consultants:  Neuro (tele)/phone Palliative care  Subjective: Doing well this AM and then when working with PT went unresponsive--- appeared to be behavioral-- was able to have hand fall away from face to protect.  BP elevated on dinamap but close to normal when manually checked -yelling out for mom and dad, wanting to join them in heaven  Objective: Vitals:   05/17/17 1355 05/17/17 1420 05/17/17 1457 05/17/17 1546  BP: (!) 197/86 (!) 144/84 (!) 144/82 (!) 151/58  Pulse: 80   (!) 53  Resp: 17   16  Temp: 98.4 F (36.9 C)   97.8 F (36.6 C)  TempSrc: Oral   Oral  SpO2: 100%   98%  Weight:      Height:        Intake/Output Summary (Last 24 hours) at 05/17/2017 1629 Last data filed at 05/17/2017 1120 Gross per 24 hour  Intake 240 ml  Output 300 ml  Net -60 ml   Filed Weights   05/15/17 0902 05/15/17 0903  Weight: 77.1 kg (170 lb) 77.1 kg (170 lb)    Examination:  General exam: this AM was talkative with god eye contact-- this PM was minimally responsive at times Respiratory system: clear Cardiovascular system: rrr Gastrointestinal system: +Bs, soft Central nervous system: see above    Data Reviewed: I have personally reviewed following labs and imaging studies  CBC: Recent Labs  Lab 05/13/17 1626 05/15/17 1122  WBC 7.3 5.7  NEUTROABS 4.4 2.5  HGB 11.6* 11.9*  HCT 33.8* 34.4*  MCV 77.5* 77.0*  PLT 384 392   Basic Metabolic Panel: Recent Labs  Lab 05/13/17 1626 05/15/17 1122 05/17/17 0415  NA 128* 128* 131*  K 4.5 4.5 4.2  CL 93* 96* 98*  CO2 19* 21* 23  GLUCOSE 59* 120* 130*  BUN CREATININE 1.00 0.88 0.78  CALCIUM 9.7 9.3 9.5   GFR: Estimated Creatinine Clearance: 45.1 mL/min (by C-G formula based on SCr of 0.78 mg/dL). Liver Function Tests: Recent Labs  Lab 05/13/17 1626 05/15/17 1122  AST 37 38  ALT 25 26  ALKPHOS 306*  292*  BILITOT 0.6 0.4  PROT 8.0 8.5*  ALBUMIN 3.4* 3.7   No results for input(s): LIPASE, AMYLASE in the last 168 hours. No results for input(s): AMMONIA in the last 168 hours. Coagulation Profile: No results for input(s): INR, PROTIME in the last 168 hours. Cardiac Enzymes: Recent Labs  Lab 05/15/17 1122 05/16/17 0253 05/16/17 0828 05/16/17 1640  TROPONINI 0.06* 0.03* 0.03* 0.03*   BNP (last 3 results) No results for input(s): PROBNP in the last 8760 hours. HbA1C: Recent Labs    05/16/17 0253  HGBA1C 8.0*   CBG: Recent Labs  Lab 05/16/17 1751 05/16/17 2204 05/17/17 0750 05/17/17 1210 05/17/17 1435  GLUCAP 118* 208* 110* 165* 158*   Lipid Profile: Recent Labs    05/16/17 0828  CHOL 105  HDL 35*  LDLCALC 59  TRIG 53  CHOLHDL 3.0   Thyroid Function Tests: Recent Labs    05/17/17 0415  TSH 1.429   Anemia Panel: No results for input(s): VITAMINB12, FOLATE, FERRITIN, TIBC, IRON, RETICCTPCT in the last 72 hours. Urine analysis:  Component Value Date/Time   COLORURINE YELLOW 05/15/2017 1011   APPEARANCEUR CLEAR 05/15/2017 1011   LABSPEC <1.005 (L) 05/15/2017 1011   PHURINE 6.0 05/15/2017 1011   GLUCOSEU NEGATIVE 05/15/2017 1011   HGBUR NEGATIVE 05/15/2017 1011   BILIRUBINUR NEGATIVE 05/15/2017 1011   KETONESUR NEGATIVE 05/15/2017 1011   PROTEINUR NEGATIVE 05/15/2017 1011   NITRITE NEGATIVE 05/15/2017 1011   LEUKOCYTESUR NEGATIVE 05/15/2017 1011     Recent Results (from the past 240 hour(s))  Urine culture     Status: Abnormal   Collection Time: 05/13/17  3:20 PM  Result Value Ref Range Status   Specimen Description URINE, RANDOM  Final   Special Requests NONE  Final   Culture (A)  Final    <10,000 COLONIES/mL INSIGNIFICANT GROWTH Performed at [facility], [address], [place], [place] [address]    Report Status 05/14/2017 FINAL  Final      Anti-infectives (From admission, onward)   None       Radiology Studies: Mr Brain  Wo Contrast  Result Date: 05/15/2017 CLINICAL DATA:  Hypoglycemia this morning. Transient slurred speech and left-sided weakness. EXAM: MRI HEAD WITHOUT CONTRAST MRA HEAD WITHOUT CONTRAST TECHNIQUE: Multiplanar, multiecho pulse sequences of the brain and surrounding structures were obtained without intravenous contrast. Angiographic images of the head were obtained using MRA technique without contrast. COMPARISON:  Head CT earlier today FINDINGS: MRI HEAD FINDINGS Brain: No acute infarction, hemorrhage, hydrocephalus, extra-axial collection or mass lesion. Cerebral atrophy. Mild chronic small vessel ischemia for age. FLAIR signal around the lower brainstem is attributed to flow artifact. Vascular: Arterial findings below. Normal dural venous sinus flow voids Skull and upper cervical spine: No evidence of marrow lesion. C3-4 disc degeneration with calcified disc protrusion on recent CT cervical spine. There is ligamentum flavum thickening at the same level with cord flattening. Hyperostosis interna. Sinuses/Orbits: Bilateral cataract resection. Essentially clear sinuses. MRA HEAD FINDINGS Symmetric carotid and vertebral arteries. Dominant left PICA and right AICA. No flow limiting stenosis in the anterior circulation. There is high-grade distal left P2 segment narrowing with less robust signal in the downstream vessel compared to the right. No beading or aneurysm. IMPRESSION: Brain MRI: 1. Senescent changes without acute finding.  No acute infarct. 2. C3-4 degenerative cord compression. Intracranial MRA: 1. High-grade distal left P2 segment stenosis. 2. Otherwise, negative. Electronically Signed   By: Marnee Spring M.D.   On: 05/15/2017 19:51   Mr Maxine Glenn Head Wo Contrast  Result Date: 05/15/2017 CLINICAL DATA:  Hypoglycemia this morning. Transient slurred speech and left-sided weakness. EXAM: MRI HEAD WITHOUT CONTRAST MRA HEAD WITHOUT CONTRAST TECHNIQUE: Multiplanar, multiecho pulse sequences of the brain and  surrounding structures were obtained without intravenous contrast. Angiographic images of the head were obtained using MRA technique without contrast. COMPARISON:  Head CT earlier today FINDINGS: MRI HEAD FINDINGS Brain: No acute infarction, hemorrhage, hydrocephalus, extra-axial collection or mass lesion. Cerebral atrophy. Mild chronic small vessel ischemia for age. FLAIR signal around the lower brainstem is attributed to flow artifact. Vascular: Arterial findings below. Normal dural venous sinus flow voids Skull and upper cervical spine: No evidence of marrow lesion. C3-4 disc degeneration with calcified disc protrusion on recent CT cervical spine. There is ligamentum flavum thickening at the same level with cord flattening. Hyperostosis interna. Sinuses/Orbits: Bilateral cataract resection. Essentially clear sinuses. MRA HEAD FINDINGS Symmetric carotid and vertebral arteries. Dominant left PICA and right AICA. No flow limiting stenosis in the anterior circulation. There is high-grade distal left P2 segment  narrowing with less robust signal in the downstream vessel compared to the right. No beading or aneurysm. IMPRESSION: Brain MRI: 1. Senescent changes without acute finding.  No acute infarct. 2. C3-4 degenerative cord compression. Intracranial MRA: 1. High-grade distal left P2 segment stenosis. 2. Otherwise, negative. Electronically Signed   By: Marnee Spring M.D.   On: 05/15/2017 19:51        Scheduled Meds: . aspirin  81 mg Oral Daily  . carvedilol  3.125 mg Oral BID WC  . clopidogrel  75 mg Oral Daily  . enoxaparin (LOVENOX) injection  40 mg Subcutaneous Q24H  . hydrALAZINE  10 mg Oral Q8H  . insulin aspart  0-9 Units Subcutaneous TID WC  . levothyroxine  75 mcg Oral QAC breakfast  . losartan  100 mg Oral Daily  . polyethylene glycol  17 g Oral Daily  . rosuvastatin  20 mg Oral Daily   Continuous Infusions:   LOS: 0 days    Time spent: 35 min    Joseph Art, DO [facility] Pager [phone]  If 7PM-7AM, please contact night-coverage [email or web address] Password [facility] 05/17/2017, 4:29 PM

## 2017-05-17 NOTE — Evaluation (Signed)
Speech Language Pathology Evaluation Patient Details Name: Debbie Ortiz MRN: [id number] DOB: December 25, 1923 Today's Date: 05/17/2017 Time: 4782-9562 SLP Time Calculation (min) (ACUTE ONLY): 21 min  Problem List:  Patient Active Problem List   Diagnosis Date Noted  . TIA (transient ischemic attack) 05/15/2017  . Acute hyponatremia 05/15/2017  . Hypertension 05/15/2017  . Hypothyroidism 05/15/2017  . History of CHF (congestive heart failure) 05/15/2017  . History of urinary retention 05/15/2017  . Diabetes mellitus without complication (HCC) 05/15/2017  . Elevated troponin 05/15/2017   Past Medical History:  Past Medical History:  Diagnosis Date  . Arthritis   . CHF (congestive heart failure) (HCC)   . Diabetes mellitus without complication (HCC)   . Hypertension   . Thyroid disease    Past Surgical History: History reviewed. No pertinent surgical history. HPI:  84 y.o. female with medical history significant for CHF unknown subtype, hypertension, hypothyroidism, diabetes on oral agents. Patient was seen in the ER on 4/26 after a fall with no significant abnormalities detected so she was sent back to the nursing facility.  On 4/28 patient was discovered with a CBG of 22 and EMS was called to the facility.  Prior to their arrival patient had been given orange juice to drink and follow-up CBG was 127.  She was brought to [facility] further evaluation.  Prior to return to facility patient developed slurred speech and left-sided weakness.  Telemetry neurology was consulted and by the time they were able to evaluate the patient her neurological symptoms had resolved.  Recommended TIA evaluation (including MRI); MRI revealed C3-4 degenerative cord compression   Assessment / Plan / Recommendation Clinical Impression   Pt is tangential with difficulty sustaining attention for topic maintenance. The information she provides is conflicting, with reduced anticipatory awareness and safety awareness when  discussing care post-discharge. She is oriented to person and knows that she is in the hospital, but is disoriented to time and situation. Min cues were provided for functional problem solving for how to operate her call bell, but then pt needed Max cues for initiation to use it. It is unknown what pt's baseline level of function is, but would anticipate that pt will need a higher level of care than ILF wtih 24/7 supervision. SLP will continue to follow acutely to determine if post-acute therapy is indicated.    SLP Assessment  SLP Recommendation/Assessment: Patient needs continued Speech Lanaguage Pathology Services SLP Visit Diagnosis: Cognitive communication deficit (R41.841)    Follow Up Recommendations  Skilled Nursing facility    Frequency and Duration min 2x/week  1 week      SLP Evaluation Cognition  Overall Cognitive Status: No family/caregiver present to determine baseline cognitive functioning Arousal/Alertness: Awake/alert Orientation Level: Oriented to person;Oriented to place;Disoriented to time;Disoriented to situation Attention: Sustained Sustained Attention: Impaired Sustained Attention Impairment: Verbal basic Awareness: Impaired Awareness Impairment: Intellectual impairment;Emergent impairment;Anticipatory impairment Problem Solving: Impaired Problem Solving Impairment: Verbal basic;Functional basic Safety/Judgment: Impaired       Comprehension  Auditory Comprehension Overall Auditory Comprehension: Impaired Conversation: Simple Reading Comprehension Reading Status: Not tested    Expression Expression Primary Mode of Expression: Verbal Verbal Expression Overall Verbal Expression: Impaired Initiation: No impairment Level of Generative/Spontaneous Verbalization: Conversation Pragmatics: Impairment Impairments: Topic maintenance;Turn Taking Interfering Components: Attention Non-Verbal Means of Communication: Not applicable Written Expression Written  Expression: Not tested   Oral / Motor  Motor Speech Overall Motor Speech: Appears within functional limits for tasks assessed   GO  Maxcine Ham 05/17/2017, 1:02 PM   Maxcine Ham, M.A. CCC-SLP [phone]

## 2017-05-17 NOTE — Progress Notes (Signed)
Physical Therapy Treatment Patient Details Name: Debbie Ortiz MRN: [id number] DOB: 18-Nov-1923 Today's Date: 05/17/2017    History of Present Illness 84 y.o. female with medical history significant for CHF unknown subtype, hypertension, hypothyroidism, diabetes on oral agents. Patient was seen in the ER on 4/26 after a fall with no significant abnormalities detected so she was sent back to the nursing facility.  On 4/28 patient was discovered with a CBG of 22 and EMS was called to the facility.  Prior to their arrival patient had been given orange juice to drink and follow-up CBG was 127.  She was brought to [facility] further evaluation.  Prior to return to facility patient developed slurred speech and left-sided weakness.  Telemetry neurology was consulted and by the time they were able to evaluate the patient her neurological symptoms had resolved.  Recommended TIA evaluation (including MRI); MRI revealed C3-4 degenerative cord compressio    PT Comments    Patient seen for further assessment with activity in the hopes of better performance as patient is more awake today. Patient presented to therapist upright in bed and engaged in conversation. Assisted patient to EOB and patient became unresponsive. Multiple attempts to maintain EOB balance required assist and patient inconsistent with degree of arousal and activity, while cueing patient for further participation patient became lethargic and unresponsive. Patient positioned flat in bed and nursing called to room. BP assessed and elevated with upward trend. Patient remained minimally responsive, MD paged and arrived at bedside for further assessment. HR stable throughout activity. At this time, feel patient disposition remains appropriate. Do not feel patient is safe for return to ILF and recommend SNF for post acute activity. Will continue to see and progress as tolerated.  Family at bedside with MD and nursing.  Follow Up Recommendations  SNF      Equipment Recommendations  Rolling walker with 5" wheels    Recommendations for Other Services       Precautions / Restrictions Precautions Precautions: Fall    Mobility  Bed Mobility Overal bed mobility: Needs Assistance Bed Mobility: Supine to Sit;Sit to Supine     Supine to sit: Mod assist;HOB elevated Sit to supine: Total assist;HOB elevated   General bed mobility comments: Patient with noted episode of unresponsiveness when coming from sitting upright in bed to sitting upright at EOB  Transfers                 General transfer comment: unable to perform  Ambulation/Gait             General Gait Details: medical issues preventing ability to perform   Stairs             Wheelchair Mobility    Modified Rankin (Stroke Patients Only)       Balance Overall balance assessment: Needs assistance Sitting-balance support: Feet supported Sitting balance-Leahy Scale: Poor                                      Cognition Arousal/Alertness: Awake/alert(initially awake and then unresponsive/lethargic) Behavior During Therapy: WFL for tasks assessed/performed Overall Cognitive Status: History of cognitive impairments - at baseline Area of Impairment: Orientation;Attention;Memory;Safety/judgement                 Orientation Level: Place;Situation Current Attention Level: Selective Memory: Decreased recall of precautions;Decreased short-term memory   Safety/Judgement: Decreased awareness of safety;Decreased awareness of deficits  General Comments: patient with episodes of lability and unresponsiveness during session      Exercises      General Comments        Pertinent Vitals/Pain Pain Assessment: Faces Faces Pain Scale: Hurts little more Pain Location: abdomen, requesting to be repositioned Pain Descriptors / Indicators: Discomfort Pain Intervention(s): Limited activity within patient's tolerance    Home Living                       Prior Function            PT Goals (current goals can now be found in the care plan section) Acute Rehab PT Goals Patient Stated Goal: to be able to walk PT Goal Formulation: With patient Time For Goal Achievement: 05/30/17 Potential to Achieve Goals: Fair Progress towards PT goals: Not progressing toward goals - comment    Frequency    Min 2X/week      PT Plan Current plan remains appropriate    Co-evaluation              AM-PAC PT "6 Clicks" Daily Activity  Outcome Measure  Difficulty turning over in bed (including adjusting bedclothes, sheets and blankets)?: Unable Difficulty moving from lying on back to sitting on the side of the bed? : Unable Difficulty sitting down on and standing up from a chair with arms (e.g., wheelchair, bedside commode, etc,.)?: Unable Help needed moving to and from a bed to chair (including a wheelchair)?: A Little Help needed walking in hospital room?: A Little Help needed climbing 3-5 steps with a railing? : Total 6 Click Score: 10    End of Session Equipment Utilized During Treatment: Gait belt Activity Tolerance: Patient tolerated treatment well;Patient limited by fatigue;Treatment limited secondary to medical complications (Comment) Patient left: in bed;with call bell/phone within reach;with nursing/sitter in room;with family/visitor present Nurse Communication: Mobility status PT Visit Diagnosis: Unsteadiness on feet (R26.81);Muscle weakness (generalized) (M62.81);History of falling (Z91.81);Adult, failure to thrive (R62.7)     Time: 1478-2956 PT Time Calculation (min) (ACUTE ONLY): 25 min  Charges:  $Therapeutic Activity: 23-37 mins                    G Codes:       Charlotte Crumb, PT DPT  Board Certified Neurologic Specialist [phone]    Fabio Asa 05/17/2017, 3:21 PM

## 2017-05-17 NOTE — Care Management Note (Signed)
Case Management Note  Patient Details  Name: Debbie Ortiz MRN: [id number] Date of Birth: 04-20-1923  Subjective/Objective:    Presents with TIA like symptoms ,Hx of  CHF, hypertension, hypothyroidism, diabetes. From  ILF,  [facility]. PTA active with [facility] for home health services.   HCPOA: Debbie Ortiz,[phone]     PCP: [facility]/ Dr. Harrison Mons        Action/Plan: Transition to home with home health and palliative care services to follow. NCM made referral for palliative care services with [facility].  Pt will need nonemergency ambulance for transportation services to home.  Expected Discharge Date:   05/18/2017           Expected Discharge Plan:  Home w Home Health Services  In-House Referral:  Clinical Social Work  Discharge planning Services  CM Consult  Post Acute Care Choice:    Choice offered to:  [facility] POA / Debbie Ortiz, [phone])  DME Arranged:    DME Agency:     HH Arranged:   RN,PT,OT,NA,SW HH Agency:  [facility], pending MD's orders.  Status of Service:  In process, will continue to follow  If discussed at Long Length of Stay Meetings, dates discussed:    Additional Comments:  Epifanio Lesches, RN 05/17/2017, 2:38 PM

## 2017-05-17 NOTE — Progress Notes (Signed)
Patient had 7 beats of V tach VVS and patient is symptomatic, will continue to monitor. Notified MD

## 2017-05-17 NOTE — Consult Note (Signed)
Consultation Note Date: 05/17/2017   Patient Name: Debbie Ortiz  DOB: December 18, 1923  MRN: [id number]  Age / Sex: 84 y.o., female  PCP: Patient, No Pcp Per Referring Physician: Joseph Art, DO  Reason for Consultation: Establishing goals of care and Psychosocial/spiritual support  HPI/Patient Profile: 84 y.o. female  admitted on 05/15/2017 with a past  medical history significant for CHF, hypertension, hypothyroidism, diabetes on oral agents.  Per her family she has had some mild cognitive changes.     Patient was seen in the ER on 4/26 after a fall with no significant abnormalities detected so she was sent back to the nursing facility.  This morning patient was discovered with a CBG of 22 and EMS was called to the facility.    Prior to their arrival patient had been given orange juice to drink and follow-up CBG was 127.  She was brought to [facility] further evaluation.  Patient was found to have slurred speech and left-sided weakness, she was admitted for further diagnostics and stabilization.  Per her family she recently moved to the [place] area from [place] to be closer to her family.  She now resides at a assisted living in [facility] named [facility].  Patient and family face treatment option decisions, advanced directive decisions, and anticipatory care needs.   Clinical Assessment and Goals of Care:   This NP Lorinda Creed reviewed medical records, received report from team, assessed the patient and then meet at the patient's bedside along with her healthcare power of attorney/ Virgie Dad # [phone], nephew Samantha Crimes, niece in law Lindaann Slough to discuss diagnosis, prognosis, GOC, EOL wishes disposition and options.  Concept of Hospice and Palliative Care were discussed  A detailed discussion was had today regarding advanced directives.  Concepts specific to code  status, artifical feeding and hydration, continued IV antibiotics and rehospitalization was had.  The difference between a aggressive medical intervention path  and a palliative comfort care path for this patient at this time was had.  Values and goals of care important to patient and family were attempted to be elicited.  MOST form introduced and completed.  Family verbalize an understanding that the patient is slowly failing to thrive.  Patient herself states that she is "ready when her time comes"    all agree that quality is a priority.  However at this time plan is to gently treat the treatable and hope for improvement so that patient can experience more quality time with her family.  Natural trajectory and expectations at EOL were discussed.  Questions and concerns addressed.   Family encouraged to call with questions or concerns.    PMT will continue to support holistically.    SUMMARY OF RECOMMENDATIONS    Code Status/Advance Care Planning:  DNR-documented today  Patient desires no artificial feeding now or in the future.   Symptom Management:   Weakness: Ongoing physical therapy at facility on discharge  Palliative Prophylaxis:   Aspiration, Bowel Regimen, Delirium Protocol, Frequent Pain Assessment  and Oral Care  Additional Recommendations (Limitations, Scope, Preferences):  Avoid Hospitalization and No Artificial Feeding  Psycho-social/Spiritual:   Desire for further Chaplaincy support:no  Additional Recommendations: Education on Hospice  Prognosis:   Unable to determine  Discharge Planning: Recommend palliative services to follow on discharge  To Be Determined     Primary Diagnoses: Present on Admission: . TIA (transient ischemic attack) . Acute hyponatremia . Hypertension . Hypothyroidism . Elevated troponin   I have reviewed the medical record, interviewed the patient and family, and examined the patient. The following aspects are  pertinent.  Past Medical History:  Diagnosis Date  . Arthritis   . CHF (congestive heart failure) (HCC)   . Diabetes mellitus without complication (HCC)   . Hypertension   . Thyroid disease    Social History   Socioeconomic History  . Marital status: Widowed    Spouse name: Not on file  . Number of children: Not on file  . Years of education: Not on file  . Highest education level: Not on file  Occupational History  . Not on file  Social Needs  . Financial resource strain: Not on file  . Food insecurity:    Worry: Not on file    Inability: Not on file  . Transportation needs:    Medical: Not on file    Non-medical: Not on file  Tobacco Use  . Smoking status: Never Smoker  . Smokeless tobacco: Never Used  Substance and Sexual Activity  . Alcohol use: Not Currently  . Drug use: Never  . Sexual activity: Not Currently  Lifestyle  . Physical activity:    Days per week: Not on file    Minutes per session: Not on file  . Stress: Not on file  Relationships  . Social connections:    Talks on phone: Not on file    Gets together: Not on file    Attends religious service: Not on file    Active member of club or organization: Not on file    Attends meetings of clubs or organizations: Not on file    Relationship status: Not on file  Other Topics Concern  . Not on file  Social History Narrative  . Not on file   History reviewed. No pertinent family history. Scheduled Meds: . aspirin  81 mg Oral Daily  . clopidogrel  75 mg Oral Daily  . enoxaparin (LOVENOX) injection  40 mg Subcutaneous Q24H  . insulin aspart  0-9 Units Subcutaneous TID WC  . levothyroxine  75 mcg Oral QAC breakfast  . losartan  100 mg Oral Daily  . polyethylene glycol  17 g Oral Daily  . rosuvastatin  20 mg Oral Daily   Continuous Infusions: PRN Meds:.acetaminophen **OR** acetaminophen (TYLENOL) oral liquid 160 mg/5 mL **OR** acetaminophen, diazepam Medications Prior to Admission:  Prior to  Admission medications   Medication Sig Start Date End Date Taking? Authorizing Provider  aspirin 81 MG chewable tablet Chew 81 mg by mouth daily.    Yes [provider]  atorvastatin (LIPITOR) 10 MG tablet Take 10 mg by mouth daily.    Yes [provider]  clopidogrel (PLAVIX) 75 MG tablet Take 75 mg by mouth daily.   Yes [provider]  diazepam (VALIUM) 5 MG tablet Take 1.25 mg by mouth 2 (two) times daily.    Yes [provider]  furosemide (LASIX) 40 MG tablet Take 40 mg by mouth 2 (two) times daily.   Yes [provider]  glipiZIDE (GLUCOTROL XL) 5 MG 24 hr tablet Take 5 mg by mouth daily with breakfast.   Yes [provider]  levothyroxine (SYNTHROID, LEVOTHROID) 75 MCG tablet Take 75 mcg by mouth daily before breakfast.   Yes [provider]  losartan (COZAAR) 100 MG tablet Take 100 mg by mouth daily.   Yes [provider]  meloxicam (MOBIC) 7.5 MG tablet Take 7.5 mg by mouth daily.   Yes [provider]  metFORMIN (GLUCOPHAGE) 500 MG tablet Take 500 mg by mouth daily with breakfast.   Yes [provider]  metoprolol tartrate (LOPRESSOR) 25 MG tablet Take 12.5 mg by mouth 2 (two) times daily.    Yes [provider]  nitroGLYCERIN (NITROSTAT) 0.4 MG SL tablet Place 0.4 mg under the tongue every 5 (five) minutes as needed for chest pain.   Yes [provider]  polyethylene glycol (MIRALAX / GLYCOLAX) packet Take 17 g by mouth daily.   Yes [provider]  spironolactone (ALDACTONE) 25 MG tablet Take 25 mg by mouth daily.   Yes [provider]   Allergies  Allergen Reactions  . Codeine Other (See Comments)    Reaction (??)  . Sulfa Antibiotics Other (See Comments)    Reaction (??)  . Tape Other (See Comments)    Per family, patient tolerates Coban wrap well   Review of Systems  Neurological: Positive for weakness.    Physical Exam  Constitutional: She  appears well-developed. She appears ill.  Cardiovascular: Normal rate, regular rhythm and normal heart sounds.  Pulmonary/Chest: Effort normal and breath sounds normal.  Musculoskeletal:  Generalized weakness  Neurological: She is alert.  Skin: Skin is warm and dry.  Psychiatric: Her behavior is normal. Thought content normal. Cognition and memory are normal.    Vital Signs: BP (!) 182/64 (BP Location: Right Arm)   Pulse (!) 59   Temp 98 F (36.7 C) (Oral)   Resp 19   Ht  (1.626 m)   Wt 77.1 kg (170 lb)   SpO2 98%   BMI 29.18 kg/m  Pain Scale: 0-10   Pain Score: 0-No pain   SpO2: SpO2: 98 % O2 Device:SpO2: 98 % O2 Flow Rate: .   IO: Intake/output summary: No intake or output data in the 24 hours ending 05/17/17 0900  LBM: Last BM Date: (pta) Baseline Weight: Weight: 77.1 kg (170 lb) Most recent weight: Weight: 77.1 kg (170 lb)     Palliative Assessment/Data: 30 % at best   Discussed with Marylene Land Case manager RN  Time In: 1300 Time Out: 1415 Time Total: 75 minutes Greater than 50%  of this time was spent counseling and coordinating care related to the above assessment and plan.  Signed by: Lorinda Creed, NP   Please contact Palliative Medicine Team phone at [phone] for questions and concerns.  For individual provider: See Loretha Stapler

## 2017-05-17 NOTE — Progress Notes (Signed)
CSW notes that patient resides at the [facility]. CSW awaiting medical workup to determine if patient is SNF eligible.   Debbie Ortiz Beyonce Sawatzky LCSW [phone]

## 2017-05-18 ENCOUNTER — Encounter (HOSPITAL_COMMUNITY): Payer: Self-pay | Admitting: Physician Assistant

## 2017-05-18 DIAGNOSIS — G459 Transient cerebral ischemic attack, unspecified: Principal | ICD-10-CM

## 2017-05-18 DIAGNOSIS — R748 Abnormal levels of other serum enzymes: Secondary | ICD-10-CM

## 2017-05-18 LAB — BASIC METABOLIC PANEL
Anion gap: 9 (ref 5–15)
BUN: 7 mg/dL (ref 6–20)
CALCIUM: 9.5 mg/dL (ref 8.9–10.3)
CO2: 24 mmol/L (ref 22–32)
CREATININE: 0.7 mg/dL (ref 0.44–1.00)
Chloride: 104 mmol/L (ref 101–111)
GFR calc Af Amer: >60 mL/min (ref >60)
GFR calc non Af Amer: >60 mL/min (ref >60)
GLUCOSE: 119 mg/dL — AB (ref 65–99)
Potassium: 4.1 mmol/L (ref 3.5–5.1)
Sodium: 137 mmol/L (ref 135–145)

## 2017-05-18 LAB — CBC
HEMATOCRIT: 32.6 % — AB (ref 36.0–46.0)
Hemoglobin: 10.9 g/dL — ABNORMAL LOW (ref 12.0–15.0)
MCH: 26.2 pg (ref 26.0–34.0)
MCHC: 33.4 g/dL (ref 30.0–36.0)
MCV: 78.4 fL (ref 78.0–100.0)
Platelets: 432 10*3/uL — ABNORMAL HIGH (ref 150–400)
RBC: 4.16 MIL/uL (ref 3.87–5.11)
RDW: 14.3 % (ref 11.5–15.5)
WBC: 4.6 10*3/uL (ref 4.0–10.5)

## 2017-05-18 LAB — GLUCOSE, CAPILLARY
GLUCOSE-CAPILLARY: 193 mg/dL — AB (ref 65–99)
Glucose-Capillary: 112 mg/dL — ABNORMAL HIGH (ref 65–99)
Glucose-Capillary: 131 mg/dL — ABNORMAL HIGH (ref 65–99)
Glucose-Capillary: 162 mg/dL — ABNORMAL HIGH (ref 65–99)

## 2017-05-18 LAB — VITAMIN B12: Vitamin B-12: 143 pg/mL — ABNORMAL LOW (ref 180–914)

## 2017-05-18 MED ORDER — MAGNESIUM SULFATE 2 GM/50ML IV SOLN
2.0000 g | Freq: Once | INTRAVENOUS | Status: AC
  Administered 2017-05-18: 2 g via INTRAVENOUS
  Filled 2017-05-18: qty 50

## 2017-05-18 MED ORDER — VITAMIN B-12 100 MCG PO TABS
500.0000 ug | ORAL_TABLET | Freq: Every day | ORAL | Status: DC
  Administered 2017-05-18 – 2017-05-20 (×3): 500 ug via ORAL
  Filled 2017-05-18 (×3): qty 5

## 2017-05-18 NOTE — Progress Notes (Signed)
Bladder scan = 481 ml urine. Assisted patient to bedside commode where she urinated 400 ml. Rescan of bladder volume = 0 ml.

## 2017-05-18 NOTE — Progress Notes (Signed)
CCMD notified RN of pt's HR sustaining in the 40's. Pt assymptomatic, is talking and answering appropriately. Fuller Mandril, MD. Discontinued Hydralazine. Will continue to monitor pt.

## 2017-05-18 NOTE — Progress Notes (Addendum)
PROGRESS NOTE    Debbie Ortiz  RUE:[id number] DOB: [birth year] DOA: 05/15/2017 PCP: Almetta Lovely, [facility]   Outpatient Specialists:    Brief Narrative:  Debbie Ortiz is a 84 y.o. female with medical history significant for CHF unknown subtype, hypertension, hypothyroidism, diabetes on oral agents.  Patient with recent issues regarding urinary retention was negative for infectious etiology.  Patient was seen in the ER on 4/26 after a fall with no significant abnormalities detected so she was sent back to the nursing facility.  This morning patient was discovered with a CBG of 22 and EMS was called to the facility.  Prior to their arrival patient had been given orange juice to drink and follow-up CBG was 127.  Etiology was not clear but now on tele she has had bradycardia with HB as well as runs of VT (cards consult although not sure what intervention would be ok)    Assessment & Plan:   Principal Problem:   TIA (transient ischemic attack) Active Problems:   Acute hyponatremia   Hypertension   Hypothyroidism   History of CHF (congestive heart failure)   History of urinary retention   Diabetes mellitus without complication (HCC)   Elevated troponin   Palliative care by specialist   DNR (do not resuscitate)   Weakness generalized  Bradycardia with 2nd degree HB with intermittent runs of v tach (nursing reports complete HB but do not see his on EKG/tele) -in light of several syncopal events will get cardiology consult -not sure she is a candidate for intervention -d/c BB -replace Mg -echo done   TIA (transient ischemic attack)/syncope (? HB vs v tach as tele now shows both) -Patient presented in context of recent hypoglycemia and developed transient slurred speech and left-sided weakness which has completely resolved -MRI brain negative for CVA -Echocardiogram/carotid duplex -HgbA1c/FLP- done -PT/OT/SLP evaluation-- SNF-- needs higher level of care than ILF -asa/plavix  per neuro -Evaluated by tele-neurologist at [facility]- discussed with neuro here    Diabetes mellitus without complication  -Recent issues with hypoglycemia that responded to orange juice at facility -Hold preadmission metformin and Glucotrol -SSI-- may not need glucotrol when d/c'd due to hypoglycemia -Carb modified diet -hgbA1c: 8-- at her age would not try to be stricter with control as she presented to the hospital with BS <50    Elevated troponin -flat No chest pain    Acute hyponatremia  -Likely related to preadmission utilization of both Lasix and Aldactone -Has received normal saline IV fluid since arrival to ER -improved    Hypertension -Patient on diuretics as well as ARB and beta-blocker prior to admission -Continue ARB  -d/c BB    History of CHF (congestive heart failure) -Subtype unknown -Prior to admission patient on Aldactone, twice daily Lasix, low-dose beta-blocker and high-dose ARB -Daily weights, strict I's/O -echo: - Normal LV function; mild diastolic dysfunction; mild LVH with elevated LV filling pressure; mild to moderate MR.    History of urinary retention -Documented with recent issues regarding urinary retention -Suspect patient may have underlying neurogenic bladder -appears emptying bladder well    Hypothyroidism -Continue Synthroid  Dementia -patient does have some periods of confusion/anger but overall is able to tell her history      DVT prophylaxis:  Lovenox   Code Status: DNR  Family Communication: Spoke with  [place], MPOA 4/30  Disposition Plan:  pending  Consultants:  Neuro (tele)/phone Palliative care  Subjective: Angry at family who is selling her house, angry at family who  insists she go to the dining room to eat  Objective: Vitals:   05/18/17 0545 05/18/17 0859 05/18/17 0929 05/18/17 1404  BP: 129/62 (!) 141/66 (!) 167/63 (!) 148/64  Pulse: (!) 57 (!) 50 (!) 55 (!) 48  Resp: Temp: 97.6 F (36.4  C)  98.1 F (36.7 C) 98 F (36.7 C)  TempSrc:   Oral Oral  SpO2: 100%  100% 99%  Weight:      Height:        Intake/Output Summary (Last 24 hours) at 05/18/2017 1538 Last data filed at 05/17/2017 1700 Gross per 24 hour  Intake -  Output 500 ml  Net -500 ml   Filed Weights   05/15/17 0902 05/15/17 0903  Weight: 77.1 kg (170 lb) 77.1 kg (170 lb)    Examination:  General exam: NAD-- up eating and talking-- periods of anger towards her family Respiratory system: clear Cardiovascular system: decreased HR Gastrointestinal system: +Bs, soft Central nervous system: moves all 4 ext, alert and able to provide history    Data Reviewed: I have personally reviewed following labs and imaging studies  CBC: Recent Labs  Lab 05/13/17 1626 05/15/17 1122 05/18/17 0432  WBC 7.3 5.7 4.6  NEUTROABS 4.4 2.5  --   HGB 11.6* 11.9* 10.9*  HCT 33.8* 34.4* 32.6*  MCV 77.5* 77.0* 78.4  PLT 384 392 432*   Basic Metabolic Panel: Recent Labs  Lab 05/13/17 1626 05/15/17 1122 05/17/17 0415 05/17/17 1740 05/18/17 0432  NA 128* 128* 131*  --  137  K 4.5 4.5 4.2  --  4.1  CL 93* 96* 98*  --  104  CO2 19* 21* 23  --  24  GLUCOSE 59* 120* 130*  --  119*  BUN --  7  CREATININE 1.00 0.88 0.78  --  0.70  CALCIUM 9.7 9.3 9.5  --  9.5  MG  --   --   --  1.7  --    GFR: Estimated Creatinine Clearance: 45.1 mL/min (by C-G formula based on SCr of 0.7 mg/dL). Liver Function Tests: Recent Labs  Lab 05/13/17 1626 05/15/17 1122  AST 37 38  ALT 25 26  ALKPHOS 306* 292*  BILITOT 0.6 0.4  PROT 8.0 8.5*  ALBUMIN 3.4* 3.7   No results for input(s): LIPASE, AMYLASE in the last 168 hours. No results for input(s): AMMONIA in the last 168 hours. Coagulation Profile: No results for input(s): INR, PROTIME in the last 168 hours. Cardiac Enzymes: Recent Labs  Lab 05/15/17 1122 05/16/17 0253 05/16/17 0828 05/16/17 1640  TROPONINI 0.06* 0.03* 0.03* 0.03*   BNP (last 3 results) No  results for input(s): PROBNP in the last 8760 hours. HbA1C: Recent Labs    05/16/17 0253  HGBA1C 8.0*   CBG: Recent Labs  Lab 05/17/17 1435 05/17/17 1659 05/17/17 2134 05/18/17 0751 05/18/17 1225  GLUCAP 158* 136* 248* 112* 131*   Lipid Profile: Recent Labs    05/16/17 0828  CHOL 105  HDL 35*  LDLCALC 59  TRIG 53  CHOLHDL 3.0   Thyroid Function Tests: Recent Labs    05/17/17 0415  TSH 1.429   Anemia Panel: Recent Labs    05/18/17 0432  VITAMINB12 143*   Urine analysis:    Component Value Date/Time   COLORURINE YELLOW 05/15/2017 1011   APPEARANCEUR CLEAR 05/15/2017 1011   LABSPEC <1.005 (L) 05/15/2017 1011   PHURINE 6.0 05/15/2017 1011   GLUCOSEU NEGATIVE 05/15/2017  1011   HGBUR NEGATIVE 05/15/2017 1011   BILIRUBINUR NEGATIVE 05/15/2017 1011   KETONESUR NEGATIVE 05/15/2017 1011   PROTEINUR NEGATIVE 05/15/2017 1011   NITRITE NEGATIVE 05/15/2017 1011   LEUKOCYTESUR NEGATIVE 05/15/2017 1011     Recent Results (from the past 240 hour(s))  Urine culture     Status: Abnormal   Collection Time: 05/13/17  3:20 PM  Result Value Ref Range Status   Specimen Description URINE, RANDOM  Final   Special Requests NONE  Final   Culture (A)  Final    <10,000 COLONIES/mL INSIGNIFICANT GROWTH Performed at [facility], [address] [place], [place] [address]    Report Status 05/14/2017 FINAL  Final      Anti-infectives (From admission, onward)   None       Radiology Studies: No results found.      Scheduled Meds: . aspirin  81 mg Oral Daily  . clopidogrel  75 mg Oral Daily  . enoxaparin (LOVENOX) injection  40 mg Subcutaneous Q24H  . insulin aspart  0-9 Units Subcutaneous TID WC  . levothyroxine  75 mcg Oral QAC breakfast  . losartan  100 mg Oral Daily  . polyethylene glycol  17 g Oral Daily  . rosuvastatin  20 mg Oral Daily   Continuous Infusions:   LOS: 1 day    Time spent: 35 min    Joseph Art, DO [facility] Pager [phone]  If 7PM-7AM, please contact night-coverage [email or web address] Password [facility] 05/18/2017, 3:38 PM

## 2017-05-18 NOTE — Progress Notes (Signed)
CCMD notified RN at 1307 regarding pt HR sustaining in the 30's. Came up to 56. Pt is talking and answering questions appropriately. Obtained a STAT EKG. Awaiting results. Fuller Mandril, MD. Will treat according to MD orders.

## 2017-05-18 NOTE — Progress Notes (Signed)
Pt's right wrist IV became infiltrated while flushing I.  IV team unable to obtain access, waiting on IV team to use ultrasound for placement. Unable to hang Magnesium sulfate IVPB until IV access is obtained.
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
## 2017-05-18 NOTE — Progress Notes (Signed)
Bladder scan = 44 ml. Pt denies need to urinate.

## 2017-05-19 DIAGNOSIS — Z8679 Personal history of other diseases of the circulatory system: Secondary | ICD-10-CM

## 2017-05-19 LAB — GLUCOSE, CAPILLARY
GLUCOSE-CAPILLARY: 119 mg/dL — AB (ref 65–99)
GLUCOSE-CAPILLARY: 226 mg/dL — AB (ref 65–99)
Glucose-Capillary: 176 mg/dL — ABNORMAL HIGH (ref 65–99)
Glucose-Capillary: 174 mg/dL — ABNORMAL HIGH (ref 65–99)
Glucose-Capillary: 175 mg/dL — ABNORMAL HIGH (ref 65–99)

## 2017-05-19 NOTE — Progress Notes (Signed)
  Speech Language Pathology Treatment: Cognitive-Linquistic  Patient Details Name: Debbie Ortiz MRN: [id number] DOB: 03-Jul-1923 Today's Date: 05/19/2017 Time: 9629-5284 SLP Time Calculation (min) (ACUTE ONLY): 20 min  Assessment / Plan / Recommendation Clinical Impression  Patient continues to remain tangential, requiring moderate-max cueing for sustained attention to basic conversation. Question if this is at least to some degree baseline as she demonstrated borderline intellectual awareness of this deficit at end of session, verbally apologizing for "getting hot" about certain topics. Patient oriented to person, place, and largely time with the exception of exact date of the month. Moderate cueing required for specifics regarding orientation to situation. Demonstrated borderline anticipatory awareness of needs after discharging, acknowledging need for assistance however not to the degree she will likely require. Overall, cognition improving however do not anticipate patient will be able to be independent after d/c. Will continue to f/u.    HPI HPI: 84 y.o. female with medical history significant for CHF unknown subtype, hypertension, hypothyroidism, diabetes on oral agents. Patient was seen in the ER on 4/26 after a fall with no significant abnormalities detected so she was sent back to the nursing facility.  On 4/28 patient was discovered with a CBG of 22 and EMS was called to the facility.  Prior to their arrival patient had been given orange juice to drink and follow-up CBG was 127.  She was brought to [facility] further evaluation.  Prior to return to facility patient developed slurred speech and left-sided weakness.  Telemetry neurology was consulted and by the time they were able to evaluate the patient her neurological symptoms had resolved.  Recommended TIA evaluation (including MRI); MRI revealed C3-4 degenerative cord compression      SLP Plan  Continue with current plan of care        Recommendations                   Follow up Recommendations: Skilled Nursing facility SLP Visit Diagnosis: Cognitive communication deficit (X32.440) Plan: Continue with current plan of care       [facility] MA, CCC-SLP [phone]                Shirley Decamp Meryl 05/19/2017, 10:59 AM

## 2017-05-19 NOTE — Progress Notes (Signed)
Occupational Therapy Treatment Patient Details Name: Debbie Ortiz MRN: [id number] DOB: Jan 07, 1924 Today's Date: 05/19/2017    History of present illness 84 y.o. female with medical history significant for CHF unknown subtype, hypertension, hypothyroidism, diabetes on oral agents. Patient was seen in the ER on 4/26 after a fall with no significant abnormalities detected so she was sent back to the nursing facility.  On 4/28 patient was discovered with a CBG of 22 and EMS was called to the facility.  Prior to their arrival patient had been given orange juice to drink and follow-up CBG was 127.  She was brought to [facility] further evaluation.  Prior to return to facility patient developed slurred speech and left-sided weakness.  Telemetry neurology was consulted and by the time they were able to evaluate the patient her neurological symptoms had resolved.  Recommended TIA evaluation (including MRI); MRI revealed C3-4 degenerative cord compressio   OT comments  Pt with minimal attempt to participate. Hand over hand assist for grooming in bed. Pt with minimal verbalization. Pt had fed herself lunch earlier. RN and NT aware of pt's behavior, VS and blood sugar assessed. Per chart, pt had a similar episode 2 days ago with PT. Pt continues to be appropriate for SNF level rehab.  Follow Up Recommendations  SNF;Supervision/Assistance - 24 hour    Equipment Recommendations       Recommendations for Other Services      Precautions / Restrictions Precautions Precautions: Fall       Mobility Bed Mobility Overal bed mobility: Needs Assistance             General bed mobility comments: +2 total assist to position in bed  Transfers                      Balance                                           ADL either performed or assessed with clinical judgement   ADL Overall ADL's : Needs assistance/impaired   Eating/Feeding Details (indicate cue type and reason): pt  self fed lunch meal per NT Grooming: Oral care;Wash/dry hands;Wash/dry face;Total assistance;Bed level Grooming Details (indicate cue type and reason): hand over hand assist                                     Vision       Perception     Praxis      Cognition Arousal/Alertness: Lethargic Behavior During Therapy: Flat affect Overall Cognitive Status: History of cognitive impairments - at baseline                                 General Comments: pt with minimal verbalization or participation        Exercises     Shoulder Instructions       General Comments      Pertinent Vitals/ Pain       Pain Assessment: Faces Faces Pain Scale: No hurt  Home Living  Prior Functioning/Environment              Frequency  Min 2X/week        Progress Toward Goals  OT Goals(current goals can now be found in the care plan section)  Progress towards OT goals: Not progressing toward goals - comment(lethargic)  Acute Rehab OT Goals Time For Goal Achievement: 05/30/17 Potential to Achieve Goals: Fair  Plan Discharge plan remains appropriate    Co-evaluation                 AM-PAC PT "6 Clicks" Daily Activity     Outcome Measure   Help from another person eating meals?: None Help from another person taking care of personal grooming?: Total Help from another person toileting, which includes using toliet, bedpan, or urinal?: Total Help from another person bathing (including washing, rinsing, drying)?: Total Help from another person to put on and taking off regular upper body clothing?: Total Help from another person to put on and taking off regular lower body clothing?: Total 6 Click Score: 9    End of Session    OT Visit Diagnosis: Muscle weakness (generalized) (M62.81);Unsteadiness on feet (R26.81)   Activity Tolerance Patient limited by lethargy   Patient Left in  bed;with call bell/phone within reach;with bed alarm set   Nurse Communication Other (comment)(VS WNL, aware of pt's lethargy)        Time: 4098-1191 OT Time Calculation (min): 19 min  Charges: OT General Charges $OT Visit: 1 Visit OT Treatments $Self Care/Home Management : 8-22 mins  05/19/2017 Martie Round, OTR/L Pager: [phone] [name] B[employer]iff 05/19/2017, 3:54 PM

## 2017-05-19 NOTE — NC FL2 (Addendum)
[place] MEDICAID FL2 LEVEL OF CARE SCREENING TOOL     IDENTIFICATION  Patient Name: Debbie Ortiz Birthdate: 03/14/1923 Sex: female Admission Date (Current Location): 05/15/2017  [facility] and [place] Number:  Producer, television/film/video and Address:  [facility] [place]. [facility], [address], [place], [place] [address]      Provider Number: [id number]  Attending Physician Name and Address:  Haydee Salter, MD  Relative Name and Phone Number:  Janie Morning, [phone]    Current Level of Care: Hospital Recommended Level of Care: Skilled Nursing Facility Prior Approval Number:    Date Approved/Denied:   PASRR Number: [id number]  Discharge Plan: SNF    Current Diagnoses: Patient Active Problem List   Diagnosis Date Noted  . Palliative care by specialist   . DNR (do not resuscitate)   . Weakness generalized   . TIA (transient ischemic attack) 05/15/2017  . Acute hyponatremia 05/15/2017  . Hypertension 05/15/2017  . Hypothyroidism 05/15/2017  . History of CHF (congestive heart failure) 05/15/2017  . History of urinary retention 05/15/2017  . Diabetes mellitus without complication (HCC) 05/15/2017  . Elevated troponin 05/15/2017    Orientation RESPIRATION BLADDER Height & Weight     Self, Time, Situation, Place  Normal Continent, External catheter Weight: 76.2 kg (168 lb) Height:   (162.6 cm)  BEHAVIORAL SYMPTOMS/MOOD NEUROLOGICAL BOWEL NUTRITION STATUS  ([place])   Continent Diet(Please see DC Summary)  AMBULATORY STATUS COMMUNICATION OF NEEDS Skin   Extensive Assist Verbally Normal                       Personal Care Assistance Level of Assistance  Bathing, Feeding, Dressing Bathing Assistance: Maximum assistance Feeding assistance: Independent Dressing Assistance: Limited assistance     Functional Limitations Info  Sight, Hearing, Speech Sight Info: Adequate Hearing Info: Adequate Speech Info: Adequate    SPECIAL CARE FACTORS  FREQUENCY  PT (By licensed PT), OT (By licensed OT)     PT Frequency: 5x/week OT Frequency: 3x/week            Contractures      Additional Factors Info  Code Status, Allergies, Insulin Sliding Scale Code Status Info: DNR Allergies Info: Codeine, Sulfa Antibiotics, Tape   Insulin Sliding Scale Info: S       Current Medications (05/19/2017):  This is the current hospital active medication list Current Facility-Administered Medications  Medication Dose Route Frequency Provider Last Rate Last Dose  . acetaminophen (TYLENOL) tablet 650 mg  650 mg Oral Q4H PRN Russella Dar, NP   650 mg at 05/18/17 1807   Or  . acetaminophen (TYLENOL) solution 650 mg  650 mg Per Tube Q4H PRN Russella Dar, NP       Or  . acetaminophen (TYLENOL) suppository 650 mg  650 mg Rectal Q4H PRN Russella Dar, NP      . aspirin chewable tablet 81 mg  81 mg Oral Daily Russella Dar, NP   81 mg at 05/19/17 1028  . clopidogrel (PLAVIX) tablet 75 mg  75 mg Oral Daily Russella Dar, NP   75 mg at 05/19/17 1028  . diazepam (VALIUM) tablet 1 mg  1 mg Oral Q6H PRN Marlin Canary U, DO   1 mg at 05/17/17 1722  . enoxaparin (LOVENOX) injection 40 mg  40 mg Subcutaneous Q24H Russella Dar, NP   40 mg at 05/18/17 2227  . hydrALAZINE (APRESOLINE) injection 10 mg  10  mg Intravenous Q6H PRN Marlin Canary U, DO      . insulin aspart (novoLOG) injection 0-9 Units  0-9 Units Subcutaneous TID WC Vann, Jessica U, DO   5 Units at 05/19/17 1225  . levothyroxine (SYNTHROID, LEVOTHROID) tablet 75 mcg  75 mcg Oral QAC breakfast Russella Dar, NP   75 mcg at 05/19/17 0759  . losartan (COZAAR) tablet 100 mg  100 mg Oral Daily Junious Silk L, NP   100 mg at 05/19/17 1028  . polyethylene glycol (MIRALAX / GLYCOLAX) packet 17 g  17 g Oral Daily Junious Silk L, NP   17 g at 05/19/17 1029  . rosuvastatin (CRESTOR) tablet 20 mg  20 mg Oral Daily Junious Silk L, NP   20 mg at 05/19/17 1029  . vitamin B-12  (CYANOCOBALAMIN) tablet 500 mcg  500 mcg Oral Daily Marlin Canary U, DO   500 mcg at 05/19/17 1028     Discharge Medications: Please see discharge summary for a list of discharge medications.  Relevant Imaging Results:  Relevant Lab Results:   Additional Information SSN: [id number] [place], [place]

## 2017-05-19 NOTE — Progress Notes (Signed)
PROGRESS NOTE    Debbie Ortiz  GEX:[id number] DOB: January 07, 1924 DOA: 05/15/2017 PCP: Almetta Lovely, [facility]   Outpatient Specialists:    Brief Narrative:  Debbie Ortiz is a 84 y.o. female with medical history significant for CHF unknown subtype, hypertension, hypothyroidism, diabetes on oral agents.  Patient with recent issues regarding urinary retention was negative for infectious etiology.  Patient was seen in the ER on 4/26 after a fall with no significant abnormalities detected so she was sent back to the nursing facility.  This morning patient was discovered with a CBG of 22 and EMS was called to the facility.  Prior to their arrival patient had been given orange juice to drink and follow-up CBG was 127.  Etiology was not clear but now on tele she has had bradycardia with HB as well as runs of VT (cards consult although not sure what intervention would be ok)    Assessment & Plan:   Principal Problem:   TIA (transient ischemic attack) Active Problems:   Acute hyponatremia   Hypertension   Hypothyroidism   History of CHF (congestive heart failure)   History of urinary retention   Diabetes mellitus without complication (HCC)   Elevated troponin   Palliative care by specialist   DNR (do not resuscitate)   Weakness generalized  Bradycardia with 2nd degree HB with intermittent runs of v tach (nursing reports complete HB but do not see his on EKG/tele) -in light of several syncopal events will get cardiology consult; input appreciated, will do monitor on d/c -not sure she is a candidate for intervention -d/c BB -echo done - d/c to SNF tomorrow afternoon   TIA (transient ischemic attack)/syncope (? HB vs v tach as tele now shows both) -Patient presented in context of recent hypoglycemia and developed transient slurred speech and left-sided weakness which has completely resolved -MRI brain negative for CVA -Echocardiogram/carotid duplex -HgbA1c/FLP- done -PT/OT/SLP  evaluation-- SNF-- needs higher level of care than ILF -asa/plavix per neuro -Evaluated by tele-neurologist at [facility]- discussed with neuro here    Diabetes mellitus without complication  -Recent issues with hypoglycemia that responded to orange juice at facility -Hold preadmission metformin and Glucotrol -SSI-- may not need glucotrol when d/c'd due to hypoglycemia -Carb modified diet -hgbA1c: 8-- at her age would not try to be stricter with control as she presented to the hospital with BS <50    Elevated troponin -flat No chest pain    Acute hyponatremia  -Likely related to preadmission utilization of both Lasix and Aldactone -Has received normal saline IV fluid since arrival to ER -improved    Hypertension -Patient on diuretics as well as ARB and beta-blocker prior to admission -Continue ARB  -d/c BB    History of CHF (congestive heart failure) -Subtype unknown -Prior to admission patient on Aldactone, twice daily Lasix, low-dose beta-blocker and high-dose ARB -Daily weights, strict I's/O -echo: - Normal LV function; mild diastolic dysfunction; mild LVH with elevated LV filling pressure; mild to moderate MR.    History of urinary retention -Documented with recent issues regarding urinary retention -Suspect patient may have underlying neurogenic bladder -appears emptying bladder well    Hypothyroidism -Continue Synthroid  Dementia -patient does have some periods of confusion/anger but overall is able to tell her history      DVT prophylaxis:  Lovenox   Code Status: DNR  Family Communication: Spoke with  [place], MPOA 4/30  Disposition Plan:  pending  Consultants:  Neuro (tele)/phone Palliative care  Subjective: Feels  well.  Objective: Vitals:   05/18/17 1404 05/19/17 0425 05/19/17 1437 05/19/17 1535  BP: (!) 148/64 (!) 164/83 (!) 137/48 (!) 165/57  Pulse: (!) 48 (!) 57 (!) 54 (!) 53  Resp: Temp: 98 F (36.7 C) 98 F (36.7 C)  97.7 F (36.5 C) 98 F (36.7 C)  TempSrc: Oral Oral Oral Oral  SpO2: 99% 100% 97% 100%  Weight:  76.2 kg (168 lb)    Height:        Intake/Output Summary (Last 24 hours) at 05/19/2017 1721 Last data filed at 05/19/2017 1437 Gross per 24 hour  Intake 356 ml  Output 1700 ml  Net -1344 ml   Filed Weights   05/15/17 0902 05/15/17 0903 05/19/17 0425  Weight: 77.1 kg (170 lb) 77.1 kg (170 lb) 76.2 kg (168 lb)    Examination:  General exam: NAD-- up eating and talking-- periods of anger towards her family still due to her home Respiratory system: clear Cardiovascular system: decreased HR Gastrointestinal system: +Bs, soft Central nervous system: moves all 4 ext, alert and able to provide history    Data Reviewed: I have personally reviewed following labs and imaging studies  CBC: Recent Labs  Lab 05/13/17 1626 05/15/17 1122 05/18/17 0432  WBC 7.3 5.7 4.6  NEUTROABS 4.4 2.5  --   HGB 11.6* 11.9* 10.9*  HCT 33.8* 34.4* 32.6*  MCV 77.5* 77.0* 78.4  PLT 384 392 432*   Basic Metabolic Panel: Recent Labs  Lab 05/13/17 1626 05/15/17 1122 05/17/17 0415 05/17/17 1740 05/18/17 0432  NA 128* 128* 131*  --  137  K 4.5 4.5 4.2  --  4.1  CL 93* 96* 98*  --  104  CO2 19* 21* 23  --  24  GLUCOSE 59* 120* 130*  --  119*  BUN --  7  CREATININE 1.00 0.88 0.78  --  0.70  CALCIUM 9.7 9.3 9.5  --  9.5  MG  --   --   --  1.7  --    GFR: Estimated Creatinine Clearance: 44.8 mL/min (by C-G formula based on SCr of 0.7 mg/dL). Liver Function Tests: Recent Labs  Lab 05/13/17 1626 05/15/17 1122  AST 37 38  ALT 25 26  ALKPHOS 306* 292*  BILITOT 0.6 0.4  PROT 8.0 8.5*  ALBUMIN 3.4* 3.7   No results for input(s): LIPASE, AMYLASE in the last 168 hours. No results for input(s): AMMONIA in the last 168 hours. Coagulation Profile: No results for input(s): INR, PROTIME in the last 168 hours. Cardiac Enzymes: Recent Labs  Lab 05/15/17 1122 05/16/17 0253 05/16/17 0828  05/16/17 1640  TROPONINI 0.06* 0.03* 0.03* 0.03*   BNP (last 3 results) No results for input(s): PROBNP in the last 8760 hours. HbA1C: No results for input(s): HGBA1C in the last 72 hours. CBG: Recent Labs  Lab 05/18/17 2222 05/19/17 0742 05/19/17 1138 05/19/17 1530 05/19/17 1646  GLUCAP 162* 119* 226* 176* 174*   Lipid Profile: No results for input(s): CHOL, HDL, LDLCALC, TRIG, CHOLHDL, LDLDIRECT in the last 72 hours. Thyroid Function Tests: Recent Labs    05/17/17 0415  TSH 1.429   Anemia Panel: Recent Labs    05/18/17 0432  VITAMINB12 143*   Urine analysis:    Component Value Date/Time   COLORURINE YELLOW 05/15/2017 1011   APPEARANCEUR CLEAR 05/15/2017 1011   LABSPEC <1.005 (L) 05/15/2017 1011   PHURINE 6.0 05/15/2017 1011   GLUCOSEU NEGATIVE 05/15/2017  1011   HGBUR NEGATIVE 05/15/2017 1011   BILIRUBINUR NEGATIVE 05/15/2017 1011   KETONESUR NEGATIVE 05/15/2017 1011   PROTEINUR NEGATIVE 05/15/2017 1011   NITRITE NEGATIVE 05/15/2017 1011   LEUKOCYTESUR NEGATIVE 05/15/2017 1011     Recent Results (from the past 240 hour(s))  Urine culture     Status: Abnormal   Collection Time: 05/13/17  3:20 PM  Result Value Ref Range Status   Specimen Description URINE, RANDOM  Final   Special Requests NONE  Final   Culture (A)  Final    <10,000 COLONIES/mL INSIGNIFICANT GROWTH Performed at [facility], [address] [place], [place] [address]    Report Status 05/14/2017 FINAL  Final      Anti-infectives (From admission, onward)   None       Radiology Studies: No results found.      Scheduled Meds: . aspirin  81 mg Oral Daily  . clopidogrel  75 mg Oral Daily  . enoxaparin (LOVENOX) injection  40 mg Subcutaneous Q24H  . insulin aspart  0-9 Units Subcutaneous TID WC  . levothyroxine  75 mcg Oral QAC breakfast  . losartan  100 mg Oral Daily  . polyethylene glycol  17 g Oral Daily  . rosuvastatin  20 mg Oral Daily  . vitamin B-12  500 mcg  Oral Daily   Continuous Infusions:   LOS: 2 days    Time spent: 35 min    Haydee Salter, DO [facility] Pager AMION  If 7PM-7AM, please contact night-coverage [email or web address] Password [facility] 05/19/2017, 5:21 PM

## 2017-05-20 LAB — BASIC METABOLIC PANEL
ANION GAP: 7 (ref 5–15)
BUN: 11 mg/dL (ref 6–20)
CALCIUM: 9.5 mg/dL (ref 8.9–10.3)
CO2: 25 mmol/L (ref 22–32)
CREATININE: 0.75 mg/dL (ref 0.44–1.00)
Chloride: 101 mmol/L (ref 101–111)
GFR calc Af Amer: >60 mL/min (ref >60)
GFR calc non Af Amer: >60 mL/min (ref >60)
Glucose, Bld: 165 mg/dL — ABNORMAL HIGH (ref 65–99)
Potassium: 4.4 mmol/L (ref 3.5–5.1)
SODIUM: 133 mmol/L — AB (ref 135–145)

## 2017-05-20 LAB — GLUCOSE, CAPILLARY
GLUCOSE-CAPILLARY: 156 mg/dL — AB (ref 65–99)
GLUCOSE-CAPILLARY: 250 mg/dL — AB (ref 65–99)

## 2017-05-20 MED ORDER — CYANOCOBALAMIN 500 MCG PO TABS: 500.0000 ug | ORAL_TABLET | Freq: Every day | ORAL | Status: AC

## 2017-05-20 NOTE — Discharge Summary (Addendum)
Physician Discharge Summary  Debbie Ortiz ZOX:[id number] DOB: Oct 29, 1923 DOA: 05/15/2017  PCP: [facility]  Admit date: 05/15/2017 Discharge date: 05/20/2017  Time spent: 35 minutes  Recommendations for Outpatient Follow-up:  1. To  [facility]   Discharge Diagnoses:  Principal Problem:   TIA (transient ischemic attack) Active Problems:   Acute hyponatremia   Hypertension   Hypothyroidism   History of CHF (congestive heart failure)   History of urinary retention   Diabetes mellitus without complication (HCC)   Elevated troponin   Palliative care by specialist   DNR (do not resuscitate)   Weakness generalized   Discharge Condition: Good  Diet recommendation: Diabetic  Filed Weights   05/15/17 0902 05/15/17 0903 05/19/17 0425  Weight: 77.1 kg (170 lb) 77.1 kg (170 lb) 76.2 kg (168 lb)    History of present illness:  Debbie Symmonds Pateis a 84 y.o.femalewith medical history significant forCHF unknown subtype, hypertension, hypothyroidism, diabetes on oral agents.Patient with recent issues regarding urinary retention was negative for infectious etiology. Patient was seen in the ER on 4/26 after a fall with no significant abnormalities detected so she was sent back to the nursing facility. This morning patient was discovered with a CBG of 22 and EMS was called to the facility. Prior to their arrival patient had been given orange juice to drink and follow-up CBG was 127. Etiology was not clear but now on tele she has had bradycardia with HB as well as runs of VT. Subsequently admitted to the hospital for workup.  Hospital Course:  Patient was admitted and worked up fully. Cardiology was consulted and patient had echocardiogram done.  Recommendation is to hold beta blockers and discharged.  Also recommended outpatient follow-up for event monitor.  She otherwise was found to be hypoglycemic which could be partly responsible. TIA workup was done with MRI of the  brain and carotid duplex done.  Placed on aspirin and Plavix.  No evidence of acute CVA.  She is a diabetic and was on Glucotrol and metformin prior to admission.  At discharge we are discontinuing Glucotrol which could be causing hypoglycemia.  Patient also had diastolic dysfunction CHF, was stable.  She has baseline dementia which also appears to be stable.  Patient on levothyroxine for hypothyroidism which was maintained.  She was seen by physical therapy and occupational therapy.  At this point she'll be discharged to skilled facility due to severe debility.  Procedures:  MRI of the brain, echocardiogram, carotid Doppler ultrasound   Consultations:  Dr. Rollene Rotunda, Cardiology  Discharge Exam: Vitals:   05/19/17 2144 05/20/17 0507  BP: (!) 158/79 (!) 148/78  Pulse: 65 65  Resp: 18 18  Temp: (!) 97.4 F (36.3 C) 98.4 F (36.9 C)  SpO2: 98% 98%    General: stable no acute distress Cardiovascular: regular rate and rhythm Respiratory: good air entry bilaterally with no wheeze rales or crackles  Discharge Instructions   Discharge Instructions    Diet - low sodium heart healthy   Complete by:  As directed    Increase activity slowly   Complete by:  As directed      Allergies as of 05/20/2017      Reactions   Codeine Other (See Comments)   Reaction (??)   Sulfa Antibiotics Other (See Comments)   Reaction (??)   Tape Other (See Comments)   Per family, patient tolerates Coban wrap well      Medication List    STOP taking these medications  glipiZIDE 5 MG 24 hr tablet Commonly known as:  GLUCOTROL XL     TAKE these medications   aspirin 81 MG chewable tablet Chew 81 mg by mouth daily.   atorvastatin 10 MG tablet Commonly known as:  LIPITOR Take 10 mg by mouth daily.   clopidogrel 75 MG tablet Commonly known as:  PLAVIX Take 75 mg by mouth daily.   diazepam 5 MG tablet Commonly known as:  VALIUM Take 1.25 mg by mouth 2 (two) times daily.   furosemide 40  MG tablet Commonly known as:  LASIX Take 40 mg by mouth 2 (two) times daily.   levothyroxine 75 MCG tablet Commonly known as:  SYNTHROID, LEVOTHROID Take 75 mcg by mouth daily before breakfast.   losartan 100 MG tablet Commonly known as:  COZAAR Take 100 mg by mouth daily.   meloxicam 7.5 MG tablet Commonly known as:  MOBIC Take 7.5 mg by mouth daily.   metFORMIN 500 MG tablet Commonly known as:  GLUCOPHAGE Take 500 mg by mouth daily with breakfast.   metoprolol tartrate 25 MG tablet Commonly known as:  LOPRESSOR Take 12.5 mg by mouth 2 (two) times daily.   nitroGLYCERIN 0.4 MG SL tablet Commonly known as:  NITROSTAT Place 0.4 mg under the tongue every 5 (five) minutes as needed for chest pain.   polyethylene glycol packet Commonly known as:  MIRALAX / GLYCOLAX Take 17 g by mouth daily.   spironolactone 25 MG tablet Commonly known as:  ALDACTONE Take 25 mg by mouth daily.   vitamin B-12 500 MCG tablet Commonly known as:  CYANOCOBALAMIN Take 1 tablet (500 mcg total) by mouth daily. Start taking on:  05/21/2017      Allergies  Allergen Reactions  . Codeine Other (See Comments)    Reaction (??)  . Sulfa Antibiotics Other (See Comments)    Reaction (??)  . Tape Other (See Comments)    Per family, patient tolerates Coban wrap well    Contact information for follow-up providers    [facility] Follow up.   Specialty:  Home Health Services Why:  Home health services arranged Contact information: [address] [place] [address] [phone]            Contact information for after-discharge care    Destination    [facility] .   Service:  Skilled Nursing Contact information: [address] [place] [address] [phone]                   The results of significant diagnostics from this hospitalization (including imaging, microbiology, ancillary and laboratory) are listed below for reference.     Significant Diagnostic Studies: Dg Chest 2 View  Result Date: 05/13/2017 CLINICAL DATA:  Larey Seat trying to get out of bed, fatigue, weakness EXAM: CHEST - 2 VIEW COMPARISON:  Chest x-ray of 04/15/2017 FINDINGS: No active infiltrate or effusion is seen. Prominent soft tissue in the right paratracheal region probably represents ectatic great vessels but attention to this area on followup chest x-ray is recommended. Cardiomegaly is stable. No acute bony abnormality is seen. There is mild degenerative change in both shoulders. IMPRESSION: 1. No active lung disease. 2. Prominent soft tissue in the right superior paratracheal region most likely ectatic great vessels but recommend attention to this area on followup chest x-ray. 3. Stable cardiomegaly. Electronically Signed   By: Dwyane Dee M.D.   On: 05/13/2017 16:15   Dg Wrist Complete Right  Result Date: 05/13/2017 CLINICAL DATA:  Fall. EXAM: RIGHT HAND - COMPLETE 3+ VIEW; RIGHT WRIST - COMPLETE 3+ VIEW COMPARISON:  None. FINDINGS: No acute fracture or dislocation. 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 Diffuse osteopenia. Chondrocalcinosis of the TFCC. Soft tissues are unremarkable. IMPRESSION: 1.  No acute osseous abnormality. Electronically Signed   By: Obie Dredge M.D.   On: 05/13/2017 16:18   Ct Head Wo Contrast  Result Date: 05/13/2017 CLINICAL DATA:  Larey Seat getting out of bed today. EXAM: CT HEAD WITHOUT CONTRAST CT CERVICAL SPINE WITHOUT CONTRAST TECHNIQUE: Multidetector CT imaging of the head and cervical spine was performed following the standard protocol without intravenous contrast. Multiplanar CT image reconstructions of the cervical spine were also generated. COMPARISON:  None. FINDINGS: CT HEAD FINDINGS Brain: No evidence of acute infarction, hemorrhage, hydrocephalus, extra-axial collection or mass lesion/mass effect. Mild periventricular white matter hypoattenuation is noted consistent with chronic  microvascular ischemic change. Vascular: No hyperdense vessel or unexpected calcification. Skull: Normal. Negative for fracture or focal lesion. Sinuses/Orbits: Globes and orbits are unremarkable. Sinuses and mastoid air cells are clear. Other: None. CT CERVICAL SPINE FINDINGS Alignment: Normal. Skull base and vertebrae: No acute fracture. No primary bone lesion or focal pathologic process. Soft tissues and spinal canal: No prevertebral fluid or swelling. No visible canal hematoma. Disc levels: There are degenerative changes throughout the cervical spine. There is marked loss of disc height with endplate sclerosis and osteophytes at C3-C4 moderate loss of disc height and endplate spurring at C5-C6. Facet degenerative changes are noted bilaterally greatest on the left at C4-C5. No convincing disc herniation. Upper chest: No acute findings.  Minor scarring at the lung apices. Other: None. IMPRESSION: HEAD CT 1. No acute intracranial abnormalities. 2. Mild chronic microvascular ischemic change. CERVICAL CT 1. No fracture or acute finding. Electronically Signed   By: Amie Portland M.D.   On: 05/13/2017 15:48   Ct Cervical Spine Wo Contrast  Result Date: 05/13/2017 CLINICAL DATA:  Larey Seat getting out of bed today. EXAM: CT HEAD WITHOUT CONTRAST CT CERVICAL SPINE WITHOUT CONTRAST TECHNIQUE: Multidetector CT imaging of the head and cervical spine was performed following the standard protocol without intravenous contrast. Multiplanar CT image reconstructions of the cervical spine were also generated. COMPARISON:  None. FINDINGS: CT HEAD FINDINGS Brain: No evidence of acute infarction, hemorrhage, hydrocephalus, extra-axial collection or mass lesion/mass effect. Mild periventricular white matter hypoattenuation is noted consistent with chronic microvascular ischemic change. Vascular: No hyperdense vessel or unexpected calcification. Skull: Normal. Negative for fracture or focal lesion. Sinuses/Orbits: Globes and orbits are  unremarkable. Sinuses and mastoid air cells are clear. Other: None. CT CERVICAL SPINE FINDINGS Alignment: Normal. Skull base and vertebrae: No acute fracture. No primary bone lesion or focal pathologic process. Soft tissues and spinal canal: No prevertebral fluid or swelling. No visible canal hematoma. Disc levels: There are degenerative changes throughout the cervical spine. There is marked loss of disc height with endplate sclerosis and osteophytes at C3-C4 moderate loss of disc height and endplate spurring at C5-C6. Facet degenerative changes are noted bilaterally greatest on the left at C4-C5. No convincing disc herniation. Upper chest: No acute findings.  Minor scarring at the lung apices. Other: None. IMPRESSION: HEAD CT 1. No acute intracranial abnormalities. 2. Mild chronic microvascular ischemic change. CERVICAL CT 1. No fracture or acute finding. Electronically Signed   By: Amie Portland M.D.   On: 05/13/2017 15:48   Mr Brain Wo Contrast  Result Date: 05/15/2017 CLINICAL  DATA:  Hypoglycemia this morning. Transient slurred speech and left-sided weakness. EXAM: MRI HEAD WITHOUT CONTRAST MRA HEAD WITHOUT CONTRAST TECHNIQUE: Multiplanar, multiecho pulse sequences of the brain and surrounding structures were obtained without intravenous contrast. Angiographic images of the head were obtained using MRA technique without contrast. COMPARISON:  Head CT earlier today FINDINGS: MRI HEAD FINDINGS Brain: No acute infarction, hemorrhage, hydrocephalus, extra-axial collection or mass lesion. Cerebral atrophy. Mild chronic small vessel ischemia for age. FLAIR signal around the lower brainstem is attributed to flow artifact. Vascular: Arterial findings below. Normal dural venous sinus flow voids Skull and upper cervical spine: No evidence of marrow lesion. C3-4 disc degeneration with calcified disc protrusion on recent CT cervical spine. There is ligamentum flavum thickening at the same level with cord flattening.  Hyperostosis interna. Sinuses/Orbits: Bilateral cataract resection. Essentially clear sinuses. MRA HEAD FINDINGS Symmetric carotid and vertebral arteries. Dominant left PICA and right AICA. No flow limiting stenosis in the anterior circulation. There is high-grade distal left P2 segment narrowing with less robust signal in the downstream vessel compared to the right. No beading or aneurysm. IMPRESSION: Brain MRI: 1. Senescent changes without acute finding.  No acute infarct. 2. C3-4 degenerative cord compression. Intracranial MRA: 1. High-grade distal left P2 segment stenosis. 2. Otherwise, negative. Electronically Signed   By: Marnee Spring M.D.   On: 05/15/2017 19:51   Dg Abdomen Acute W/chest  Result Date: 05/15/2017 CLINICAL DATA:  Constipation.  Distention. EXAM: DG ABDOMEN ACUTE W/ 1V CHEST COMPARISON:  Chest x-ray May 13, 2017 FINDINGS: Mild prominence of soft tissue to the right of the trachea is probably ectatic vasculature. This is unchanged since April 15, 2017. The heart size borderline. The hila and mediastinum are unchanged. No pneumothorax. No nodules or masses. No focal infiltrates. No free air, portal venous gas, or pneumatosis. The bowel gas pattern is normal with no evidence of obstruction. Vascular calcifications are noted. No other acute abnormalities. IMPRESSION: 1. No acute abnormality to explain the patient's symptoms. Electronically Signed   By: Gerome Sam III M.D   On: 05/15/2017 10:57   Dg Hand Complete Right  Result Date: 05/13/2017 CLINICAL DATA:  Fall. EXAM: RIGHT HAND - COMPLETE 3+ VIEW; RIGHT WRIST - COMPLETE 3+ VIEW COMPARISON:  None. FINDINGS: No acute fracture or dislocation. 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 Diffuse osteopenia. Chondrocalcinosis of the TFCC. Soft tissues are unremarkable. IMPRESSION: 1.  No acute osseous abnormality. Electronically Signed   By: Obie Dredge M.D.   On: 05/13/2017 16:18   Mr Maxine Glenn Head  Wo Contrast  Result Date: 05/15/2017 CLINICAL DATA:  Hypoglycemia this morning. Transient slurred speech and left-sided weakness. EXAM: MRI HEAD WITHOUT CONTRAST MRA HEAD WITHOUT CONTRAST TECHNIQUE: Multiplanar, multiecho pulse sequences of the brain and surrounding structures were obtained without intravenous contrast. Angiographic images of the head were obtained using MRA technique without contrast. COMPARISON:  Head CT earlier today FINDINGS: MRI HEAD FINDINGS Brain: No acute infarction, hemorrhage, hydrocephalus, extra-axial collection or mass lesion. Cerebral atrophy. Mild chronic small vessel ischemia for age. FLAIR signal around the lower brainstem is attributed to flow artifact. Vascular: Arterial findings below. Normal dural venous sinus flow voids Skull and upper cervical spine: No evidence of marrow lesion. C3-4 disc degeneration with calcified disc protrusion on recent CT cervical spine. There is ligamentum flavum thickening at the same level with cord flattening. Hyperostosis interna. Sinuses/Orbits: Bilateral cataract resection. Essentially clear sinuses. MRA HEAD FINDINGS Symmetric carotid and vertebral arteries. Dominant left PICA  and right AICA. No flow limiting stenosis in the anterior circulation. There is high-grade distal left P2 segment narrowing with less robust signal in the downstream vessel compared to the right. No beading or aneurysm. IMPRESSION: Brain MRI: 1. Senescent changes without acute finding.  No acute infarct. 2. C3-4 degenerative cord compression. Intracranial MRA: 1. High-grade distal left P2 segment stenosis. 2. Otherwise, negative. Electronically Signed   By: Marnee Spring M.D.   On: 05/15/2017 19:51   Ct Head Code Stroke Wo Contrast  Addendum Date: 05/15/2017   ADDENDUM REPORT: 05/15/2017 11:50 ADDENDUM: These results were called by telephone at the time of interpretation on 05/15/2017 at 11:50 am to Dr. Jacalyn Lefevre , who verbally acknowledged these results.  Electronically Signed   By: Elsie Stain M.D.   On: 05/15/2017 11:50   Result Date: 05/15/2017 CLINICAL DATA:  Code stroke.  Sudden onset of altered mental status. EXAM: CT HEAD WITHOUT CONTRAST TECHNIQUE: Contiguous axial images were obtained from the base of the skull through the vertex without intravenous contrast. COMPARISON:  05/13/2017. FINDINGS: Brain: No evidence for acute infarction, hemorrhage, mass lesion, hydrocephalus, or extra-axial fluid. Mild atrophy. Hypoattenuation of white matter, consistent with small vessel disease. Vascular: Calcification of the cavernous internal carotid arteries consistent with cerebrovascular atherosclerotic disease. No signs of intracranial large vessel occlusion. Skull: No fracture.  Marked hyperostosis. Sinuses/Orbits: No acute sinus disease. No orbital findings of significance. Other: None. ASPECTS [facility] Stroke Program Early CT Score) - Ganglionic level infarction (caudate, lentiform nuclei, internal capsule, insula, M1-M3 cortex): 7 - Supraganglionic infarction (M4-M6 cortex): 3 Total score (0-10 with 10 being normal): 10 IMPRESSION: 1. Mild atrophy and small vessel disease, unchanged from recent priors. No acute stroke is evident. 2. ASPECTS is 10. These results were communicated to Dr. Laurence Slate at 11:40 amon 4/28/2019by text page via the [facility] messaging system. Electronically Signed: By: Elsie Stain M.D. On: 05/15/2017 11:41    Microbiology: Recent Results (from the past 240 hour(s))  Urine culture     Status: Abnormal   Collection Time: 05/13/17  3:20 PM  Result Value Ref Range Status   Specimen Description URINE, RANDOM  Final   Special Requests NONE  Final   Culture (A)  Final    <10,000 COLONIES/mL INSIGNIFICANT GROWTH Performed at [facility], [address], [place], [place] [address]    Report Status 05/14/2017 FINAL  Final     Labs: Basic Metabolic Panel: Recent Labs  Lab 05/13/17 1626 05/15/17 1122 05/17/17 0415  05/17/17 1740 05/18/17 0432 05/20/17 0441  NA 128* 128* 131*  --  137 133*  K 4.5 4.5 4.2  --  4.1 4.4  CL 93* 96* 98*  --  104 101  CO2 19* 21* 23  --  24 25  GLUCOSE 59* 120* 130*  --  119* 165*  BUN --  7 11  CREATININE 1.00 0.88 0.78  --  0.70 0.75  CALCIUM 9.7 9.3 9.5  --  9.5 9.5  MG  --   --   --  1.7  --   --    Liver Function Tests: Recent Labs  Lab 05/13/17 1626 05/15/17 1122  AST 37 38  ALT 25 26  ALKPHOS 306* 292*  BILITOT 0.6 0.4  PROT 8.0 8.5*  ALBUMIN 3.4* 3.7   No results for input(s): LIPASE, AMYLASE in the last 168 hours. No results for input(s): AMMONIA in the last 168 hours. CBC: Recent Labs  Lab  05/13/17 1626 05/15/17 1122 05/18/17 0432  WBC 7.3 5.7 4.6  NEUTROABS 4.4 2.5  --   HGB 11.6* 11.9* 10.9*  HCT 33.8* 34.4* 32.6*  MCV 77.5* 77.0* 78.4  PLT 384 392 432*   Cardiac Enzymes: Recent Labs  Lab 05/15/17 1122 05/16/17 0253 05/16/17 0828 05/16/17 1640  TROPONINI 0.06* 0.03* 0.03* 0.03*   BNP: BNP (last 3 results) No results for input(s): BNP in the last 8760 hours.  ProBNP (last 3 results) No results for input(s): PROBNP in the last 8760 hours.  CBG: Recent Labs  Lab 05/19/17 1530 05/19/17 1646 05/19/17 2231 05/20/17 0745 05/20/17 1206  GLUCAP 176* 174* 175* 156* 250*       SignedLonia Blood MD.  [facility] 05/20/2017, 12:24 PM

## 2017-05-20 NOTE — Progress Notes (Signed)
Patient will DC to: [facility] Anticipated DC date: 05/20/17 Family notified: [name] [facility]) Transport by: Hermina Barters   Per MD patient ready for DC to [facility]. RN, patient, patient's family, and facility notified of DC. Discharge Summary sent to facility. RN given number for report [phone]). DC packet on chart. Ambulance transport requested for patient.   CSW signing off.  Cristobal Goldmann, LCSW Clinical Social Worker [phone]

## 2017-05-20 NOTE — Clinical Social Work Placement (Signed)
   CLINICAL SOCIAL WORK PLACEMENT  NOTE  Date:  05/20/2017  Patient Details  Name: Debbie Ortiz MRN: [id number] Date of Birth: 1923/10/23  Clinical Social Work is seeking post-discharge placement for this patient at the Skilled  Nursing Facility level of care (*CSW will initial, date and re-position this form in  chart as items are completed):  Yes   Patient/family provided with [facility] Clinical Social Work Department's list of facilities offering this level of care within the geographic area requested by the patient (or if unable, by the patient's family).  Yes   Patient/family informed of their freedom to choose among providers that offer the needed level of care, that participate in Medicare, Medicaid or managed care program needed by the patient, have an available bed and are willing to accept the patient.  Yes   Patient/family informed of [facility]'s ownership interest in [facility] and [facility], as well as of the fact that they are under no obligation to receive care at these facilities.  PASRR submitted to EDS on       PASRR number received on       Existing PASRR number confirmed on 05/20/17     FL2 transmitted to all facilities in geographic area requested by pt/family on 05/20/17     FL2 transmitted to all facilities within larger geographic area on       Patient informed that his/her managed care company has contracts with or will negotiate with certain facilities, including the following:        Yes   Patient/family informed of bed offers received.  Patient chooses bed at [facility]     Physician recommends and patient chooses bed at      Patient to be transferred to [facility] on 05/20/17.  Patient to be transferred to facility by [facility]     Patient family notified on 05/20/17 of transfer.  Name of family member notified:  [name]     PHYSICIAN Please sign FL2, Please prepare priority discharge summary, including medications     Additional  Comment:    _______________________________________________ Mearl Latin, LCSWA 05/20/2017, 11:02 AM

## 2017-05-20 NOTE — Progress Notes (Signed)
Physical Therapy Treatment Patient Details Name: Debbie Ortiz MRN: [id number] DOB: [birth year] Today's Date: 05/20/2017    History of Present Illness 84 y.o. female with medical history significant for CHF unknown subtype, hypertension, hypothyroidism, diabetes on oral agents. Patient was seen in the ER on 4/26 after a fall with no significant abnormalities detected so she was sent back to the nursing facility.  On 4/28 patient was discovered with a CBG of 22 and EMS was called to the facility.  Prior to their arrival patient had been given orange juice to drink and follow-up CBG was 127.  She was brought to [facility] further evaluation.  Prior to return to facility patient developed slurred speech and left-sided weakness.  Telemetry neurology was consulted and by the time they were able to evaluate the patient her neurological symptoms had resolved.  Recommended TIA evaluation (including MRI); MRI revealed C3-4 degenerative cord compressio    PT Comments    Pt able to ambulate 40 ft with min guard and chair to follow close behind for safety. Pt required 1 seated rest break secondary to fatigue and L foot pain. She would benefit from continued skilled PT after d/c to maximize pt's functional independence and activity tolerance. D/c to SNF this PM.    Follow Up Recommendations  SNF     Equipment Recommendations  Rolling walker with 5" wheels    Recommendations for Other Services       Precautions / Restrictions Precautions Precautions: Fall Precaution Comments: Pt was placed on bed pad to sit up in chair Restrictions Weight Bearing Restrictions: No    Mobility  Bed Mobility Overal bed mobility: Needs Assistance Bed Mobility: Supine to Sit     Supine to sit: HOB elevated;Min assist;+2 for physical assistance     General bed mobility comments: min A +2 to progress to EOB with cues for technique  Transfers Overall transfer level: Needs assistance Equipment used: Rolling walker (2  wheeled) Transfers: Sit to/from Stand Sit to Stand: Min assist;+2 physical assistance;+2 safety/equipment         General transfer comment: min A to rise into standing with cues for hand placement  Ambulation/Gait Ambulation/Gait assistance: Min guard Ambulation Distance (Feet): 40 Feet(40 x 2) Assistive device: Rolling walker (2 wheeled);1 person hand held assist Gait Pattern/deviations: Step-through pattern;Decreased stride length;Shuffle;Wide base of support Gait velocity: reduced   General Gait Details: cues for RW proximity. pt required 1 seated rest break secondary to fatigue and L foot pain.   Stairs             Wheelchair Mobility    Modified Rankin (Stroke Patients Only)       Balance Overall balance assessment: Needs assistance Sitting-balance support: Feet supported Sitting balance-Leahy Scale: Fair     Standing balance support: Bilateral upper extremity supported;During functional activity Standing balance-Leahy Scale: Poor Standing balance comment: requires RW for support                            Cognition Arousal/Alertness: Lethargic Behavior During Therapy: Flat affect Overall Cognitive Status: History of cognitive impairments - at baseline Area of Impairment: Attention;Memory;Safety/judgement                   Current Attention Level: Selective Memory: Decreased recall of precautions;Decreased short-term memory   Safety/Judgement: Decreased awareness of safety;Decreased awareness of deficits            Exercises  General Comments        Pertinent Vitals/Pain Pain Assessment: Faces Faces Pain Scale: Hurts little more Pain Location: L foot Pain Descriptors / Indicators: Discomfort Pain Intervention(s): Monitored during session;Repositioned;Limited activity within patient's tolerance    Home Living                      Prior Function            PT Goals (current goals can now be found in  the care plan section) Acute Rehab PT Goals Patient Stated Goal: to be able to walk PT Goal Formulation: With patient Time For Goal Achievement: 05/30/17 Potential to Achieve Goals: Fair Progress towards PT goals: Progressing toward goals    Frequency    Min 2X/week      PT Plan Current plan remains appropriate    Co-evaluation              AM-PAC PT "6 Clicks" Daily Activity  Outcome Measure  Difficulty turning over in bed (including adjusting bedclothes, sheets and blankets)?: Unable Difficulty moving from lying on back to sitting on the side of the bed? : Unable Difficulty sitting down on and standing up from a chair with arms (e.g., wheelchair, bedside commode, etc,.)?: Unable Help needed moving to and from a bed to chair (including a wheelchair)?: A Little Help needed walking in hospital room?: A Little Help needed climbing 3-5 steps with a railing? : Total 6 Click Score: 10    End of Session Equipment Utilized During Treatment: Gait belt Activity Tolerance: Patient tolerated treatment well Patient left: with call bell/phone within reach;in chair Nurse Communication: Mobility status PT Visit Diagnosis: Unsteadiness on feet (R26.81);Muscle weakness (generalized) (M62.81);History of falling (Z91.81);Adult, failure to thrive (R62.7)     Time: 4782-9562 PT Time Calculation (min) (ACUTE ONLY): 28 min  Charges:  $Gait Training: 23-37 mins                    G Codes:       Kallie Locks, [place] Pager [phone] Acute Rehab   Sheral Apley 05/20/2017, 2:14 PM

## 2017-05-20 NOTE — Clinical Social Work Note (Signed)
Clinical Social Work Assessment  Patient Details  Name: Debbie Ortiz MRN: [id number] Date of Birth: May 09, 1923  Date of referral:  05/20/17               Reason for consult:  Facility Placement                Permission sought to share information with:  Facility Medical sales representative, Family Supports Permission granted to share information::  Yes, Verbal Permission Granted  Name::     [name]  Agency::  SNFs  Relationship::  HCPOA/Great-niece  Contact Information:  [phone]  Housing/Transportation Living arrangements for the past 2 months:  Independent Living Facility Source of Information:  Patient, Power of Attorney Patient Interpreter Needed:  None Criminal Activity/Legal Involvement Pertinent to Current Situation/Hospitalization:  No - Comment as needed Significant Relationships:  Other Family Members Lives with:  Self, Facility Resident Do you feel safe going back to the place where you live?  No Need for family participation in patient care:  Yes (Comment)  Care giving concerns:  CSW received consult for possible SNF placement at time of discharge. CSW spoke with patient's HCPOA/great-niece, [name], regarding PT recommendation of SNF placement at time of discharge. Given patient's current physical needs and fall risk, [name] has turned in a 30 day notice at the [facility] in order to find a higher level of care for the patient. Patient expressed understanding of PT recommendation and is agreeable to SNF placement at time of discharge. CSW to continue to follow and assist with discharge planning needs.   Social Worker assessment / plan:  CSW spoke with patient and [name] concerning possibility of rehab at [facility] before returning home.  Employment status:  Retired Health and safety inspector:  Medicare PT Recommendations:  Skilled Nursing Facility Information / Referral to community resources:  Skilled Nursing Facility  Patient/Family's Response to care:  [name]  recognizes need for rehab before returning home and is agreeable to a SNF in [place]. 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 CSW explained to [facility] that patient would need to participate in rehab in order for Medicare to pay for SNF.   Patient/Family's Understanding of and Emotional Response to Diagnosis, Current Treatment, and Prognosis:  Patient/family is realistic regarding therapy needs and expressed being hopeful for SNF placement. Patient expressed understanding of CSW role and discharge process as well as medical condition. No questions/concerns about plan or treatment.    Emotional Assessment Appearance:  Appears stated age Attitude/Demeanor/Rapport:  Complaining Affect (typically observed):  Appropriate, Irritable Orientation:  Oriented to Self, Oriented to Situation, Oriented to Place, Oriented to  Time Alcohol / Substance use:  Not Applicable Psych involvement (Current and /or in the community):  No (Comment)  Discharge Needs  Concerns to be addressed:  Care Coordination Readmission within the last 30 days:  No Current discharge risk:  None Barriers to Discharge:  No Barriers Identified   Mearl Latin, LCSWA 05/20/2017, 8:48 AM

## 2017-05-20 NOTE — Care Management Important Message (Signed)
Important Message  Patient Details  Name: Debbie Ortiz MRN: [id number] Date of Birth: Oct 20, 1923   Medicare Important Message Given:  No  Due to illness patient not able to sign? Unsigned copy at the bedside Dorena Bodo 05/20/2017, 3:50 PM

## 2017-05-20 NOTE — Progress Notes (Signed)
Pt prepared for d/c to SNF. IV d/c'd. Skin intact except as charted in most recent assessments. Vitals are stable. Attempted report to receiving facility 3x; transfer unsuccessful first time, other 2 times no answer. Pt transported by ambulance service.

## 2017-07-11 ENCOUNTER — Emergency Department (HOSPITAL_COMMUNITY)
Admission: EM | Admit: 2017-07-11 | Discharge: 2017-07-11 | Disposition: A | Discharge status: Home / Self Care | Payer: Medicare Other | Attending: Emergency Medicine | Admitting: Emergency Medicine

## 2017-07-11 ENCOUNTER — Emergency Department (HOSPITAL_COMMUNITY): Payer: Medicare Other

## 2017-07-11 ENCOUNTER — Other Ambulatory Visit: Payer: Self-pay

## 2017-07-11 DIAGNOSIS — Z7902 Long term (current) use of antithrombotics/antiplatelets: Secondary | ICD-10-CM | POA: Insufficient documentation

## 2017-07-11 DIAGNOSIS — R1084 Generalized abdominal pain: Secondary | ICD-10-CM | POA: Insufficient documentation

## 2017-07-11 DIAGNOSIS — I110 Hypertensive heart disease with heart failure: Secondary | ICD-10-CM | POA: Diagnosis not present

## 2017-07-11 DIAGNOSIS — I509 Heart failure, unspecified: Secondary | ICD-10-CM | POA: Insufficient documentation

## 2017-07-11 DIAGNOSIS — Z8673 Personal history of transient ischemic attack (TIA), and cerebral infarction without residual deficits: Secondary | ICD-10-CM | POA: Insufficient documentation

## 2017-07-11 DIAGNOSIS — E119 Type 2 diabetes mellitus without complications: Secondary | ICD-10-CM | POA: Insufficient documentation

## 2017-07-11 DIAGNOSIS — Z7982 Long term (current) use of aspirin: Secondary | ICD-10-CM | POA: Insufficient documentation

## 2017-07-11 DIAGNOSIS — Z79899 Other long term (current) drug therapy: Secondary | ICD-10-CM | POA: Insufficient documentation

## 2017-07-11 DIAGNOSIS — Z7984 Long term (current) use of oral hypoglycemic drugs: Secondary | ICD-10-CM | POA: Insufficient documentation

## 2017-07-11 DIAGNOSIS — E039 Hypothyroidism, unspecified: Secondary | ICD-10-CM | POA: Insufficient documentation

## 2017-07-11 DIAGNOSIS — R109 Unspecified abdominal pain: Secondary | ICD-10-CM | POA: Diagnosis present

## 2017-07-11 LAB — COMPREHENSIVE METABOLIC PANEL
ALBUMIN: 4.2 g/dL (ref 3.5–5.0)
ALT: 21 U/L (ref 14–54)
ANION GAP: 12 (ref 5–15)
AST: 26 U/L (ref 15–41)
Alkaline Phosphatase: 102 U/L (ref 38–126)
BILIRUBIN TOTAL: 1 mg/dL (ref 0.3–1.2)
BUN: 19 mg/dL (ref 6–20)
CO2: 23 mmol/L (ref 22–32)
Calcium: 9.9 mg/dL (ref 8.9–10.3)
Chloride: 96 mmol/L — ABNORMAL LOW (ref 101–111)
Creatinine, Ser: 0.98 mg/dL (ref 0.44–1.00)
GFR, EST AFRICAN AMERICAN: 56 mL/min — AB (ref >60)
GFR, EST NON AFRICAN AMERICAN: 49 mL/min — AB (ref >60)
Glucose, Bld: 79 mg/dL (ref 65–99)
Potassium: 4.2 mmol/L (ref 3.5–5.1)
Sodium: 131 mmol/L — ABNORMAL LOW (ref 135–145)
TOTAL PROTEIN: 8.2 g/dL — AB (ref 6.5–8.1)

## 2017-07-11 LAB — URINALYSIS, ROUTINE W REFLEX MICROSCOPIC
Bilirubin Urine: NEGATIVE
Glucose, UA: NEGATIVE mg/dL
Hgb urine dipstick: NEGATIVE
KETONES UR: NEGATIVE mg/dL
LEUKOCYTES UA: NEGATIVE
NITRITE: NEGATIVE
Protein, ur: NEGATIVE mg/dL
SPECIFIC GRAVITY, URINE: 1.008 (ref 1.005–1.030)
pH: 6 (ref 5.0–8.0)

## 2017-07-11 LAB — CBC
HCT: 35.5 % — ABNORMAL LOW (ref 36.0–46.0)
HEMOGLOBIN: 11.4 g/dL — AB (ref 12.0–15.0)
MCH: 26.6 pg (ref 26.0–34.0)
MCHC: 32.1 g/dL (ref 30.0–36.0)
MCV: 82.8 fL (ref 78.0–100.0)
Platelets: 323 10*3/uL (ref 150–400)
RBC: 4.29 MIL/uL (ref 3.87–5.11)
RDW: 15.9 % — ABNORMAL HIGH (ref 11.5–15.5)
WBC: 3.9 10*3/uL — AB (ref 4.0–10.5)

## 2017-07-11 LAB — LIPASE, BLOOD: Lipase: 34 U/L (ref 11–51)

## 2017-07-11 LAB — BRAIN NATRIURETIC PEPTIDE: B NATRIURETIC PEPTIDE 5: 77.9 pg/mL (ref 0.0–100.0)

## 2017-07-11 MED ORDER — IOHEXOL 300 MG/ML  SOLN
100.0000 mL | Freq: Once | INTRAMUSCULAR | Status: AC | PRN
  Administered 2017-07-11: 100 mL via INTRAVENOUS

## 2017-07-11 NOTE — ED Notes (Signed)
Patient called for room x 1 with no response from lobby.  

## 2017-07-11 NOTE — ED Triage Notes (Addendum)
Pt arrives [facility] EMS [facility]. Called out for SOB. Per EMS lungs CTA, no swelling noted. Pt c/o generalized weakness and right knee pain with walking. VSS. Now c/o abdominal pain that makes her feel like she'll burst.

## 2017-07-11 NOTE — Discharge Instructions (Addendum)
Please use additional Miralax, up to 8 capfuls in 32 ounces of fluid until you have a substantial bowel movement.

## 2017-07-11 NOTE — ED Notes (Signed)
Pt is in waiting room. Sitting in pink wheelchair with black cap on her head. Sitting near vending machines.

## 2017-07-11 NOTE — ED Provider Notes (Signed)
Patient placed in Quick Look pathway, seen and evaluated   Chief Complaint: abdominal pain, shortness of breath  HPI:   Floydene FlockJulia Mae Pann is a 84 y.o. female who arrived to the ED via EMS from [facility]. Patient c/o shortness of breath and abdominal Last BM this morning and loose. Patient had taken Miralax this morning.   ROS: GI: abdominal pain  Resp: shortness of breath  Physical Exam:  BP (!) 149/69 (BP Location: Right Arm)   Pulse 66   Temp 99.7 F (37.6 C) (Oral)   Resp 18   Ht 5\' 4"  (1.626 m)   Wt 76.2 kg (168 lb)   SpO2 100%   BMI 28.84 kg/m    Gen: No distress  Neuro: Awake and Alert  Skin: Warm and dry  Lungs: no wheezing or rales  Abdomen: soft, normal BS, non tender on palpation.        Initiation of care has begun. The patient has been counseled on the process, plan, and necessity for staying for the completion/evaluation, and the remainder of the medical screening examination    Janne Napoleoneese, Hope M, NP 07/11/17 1402    Loren RacerYelverton, David, MD 07/12/17 [phone]

## 2017-07-11 NOTE — ED Provider Notes (Signed)
[facility] EMERGENCY DEPARTMENT Provider Note   CSN: [id number] Arrival date & time: 07/11/17  1343  History   Chief Complaint Chief Complaint  Patient presents with  . Abdominal Pain    HPI Debbie Ortiz is a 84 y.o. female.  Along with abdominal pain, also complained of dyspnea in triage. States this is resolved and currently denies CP or SOB.   The history is provided by the patient.  Abdominal Pain   This is a new problem. The current episode started more than 1 week ago. The problem occurs daily. The problem has been gradually worsening. The quality of the pain is aching and cramping. The pain is moderate. Pertinent negatives include anorexia, fever, diarrhea, nausea, vomiting, constipation, dysuria, hematuria and arthralgias. Nothing aggravates the symptoms. Nothing relieves the symptoms. Past workup does not include CT scan.    Past Medical History:  Diagnosis Date  . Arthritis   . CHF (congestive heart failure) (HCC)   . Diabetes mellitus without complication (HCC)   . Hypertension   . Thyroid disease     Patient Active Problem List   Diagnosis Date Noted  . Palliative care by specialist   . DNR (do not resuscitate)   . Weakness generalized   . TIA (transient ischemic attack) 05/15/2017  . Acute hyponatremia 05/15/2017  . Hypertension 05/15/2017  . Hypothyroidism 05/15/2017  . History of CHF (congestive heart failure) 05/15/2017  . History of urinary retention 05/15/2017  . Diabetes mellitus without complication (HCC) 05/15/2017  . Elevated troponin 05/15/2017    No past surgical history on file.   OB History   None      Home Medications    Prior to Admission medications   Medication Sig Start Date End Date Taking? Authorizing Provider  aspirin 81 MG chewable tablet Chew 81 mg by mouth daily.     [provider]  atorvastatin (LIPITOR) 10 MG tablet Take 10 mg by mouth daily.     [provider]  clopidogrel  (PLAVIX) 75 MG tablet Take 75 mg by mouth daily.    [provider]  diazepam (VALIUM) 5 MG tablet Take 1.25 mg by mouth 2 (two) times daily.     [provider]  furosemide (LASIX) 40 MG tablet Take 40 mg by mouth 2 (two) times daily.    [provider]  levothyroxine (SYNTHROID, LEVOTHROID) 75 MCG tablet Take 75 mcg by mouth daily before breakfast.    [provider]  losartan (COZAAR) 100 MG tablet Take 100 mg by mouth daily.    [provider]  meloxicam (MOBIC) 7.5 MG tablet Take 7.5 mg by mouth daily.    [provider]  metFORMIN (GLUCOPHAGE) 500 MG tablet Take 500 mg by mouth daily with breakfast.    [provider]  metoprolol tartrate (LOPRESSOR) 25 MG tablet Take 12.5 mg by mouth 2 (two) times daily.     [provider]  nitroGLYCERIN (NITROSTAT) 0.4 MG SL tablet Place 0.4 mg under the tongue every 5 (five) minutes as needed for chest pain.    [provider]  polyethylene glycol (MIRALAX / GLYCOLAX) packet Take 17 g by mouth daily.    [provider]  spironolactone (ALDACTONE) 25 MG tablet Take 25 mg by mouth daily.    [provider]  vitamin B-12 (CYANOCOBALAMIN) 500 MCG tablet Take 1 tablet (500 mcg total) by mouth daily. 05/21/17   Rometta Emery, MD    Family History Family  History  Problem Relation Age of Onset  . Heart disease Father        details not clear    Social History Social History   Tobacco Use  . Smoking status: Never Smoker  . Smokeless tobacco: Never Used  Substance Use Topics  . Alcohol use: Not Currently  . Drug use: Never     Allergies   Codeine; Sulfa antibiotics; and Tape   Review of Systems Review of Systems  Constitutional: Negative for chills and fever.  HENT: Negative for ear pain and sore throat.   Eyes: Negative for pain and visual disturbance.  Respiratory: Positive for shortness of breath (resolved). Negative for cough.     Cardiovascular: Negative for chest pain and palpitations.  Gastrointestinal: Positive for abdominal distention and abdominal pain. Negative for anorexia, constipation, diarrhea, nausea and vomiting.  Genitourinary: Negative for dysuria and hematuria.  Musculoskeletal: Negative for arthralgias and back pain.  Skin: Negative for color change and rash.  Neurological: Negative for seizures and syncope.  All other systems reviewed and are negative.    Physical Exam Updated Vital Signs BP (!) 147/70   Pulse 64   Temp 97.8 F (36.6 C) (Oral)   Resp 13   Ht 5\' 4"  (1.626 m)   Wt 76.2 kg (168 lb)   SpO2 100%   BMI 28.84 kg/m   Physical Exam  Constitutional: She appears well-developed and well-nourished. No distress.  HENT:  Head: Normocephalic and atraumatic.  Mouth/Throat: Oropharynx is clear and moist.  Eyes: Conjunctivae and EOM are normal.  Neck: Neck supple.  Cardiovascular: Normal rate, regular rhythm and intact distal pulses.  No murmur heard. Pulmonary/Chest: Effort normal and breath sounds normal. No stridor. No respiratory distress. She has no rales.  Abdominal: Soft. She exhibits no distension. There is tenderness in the periumbilical area. There is no rebound and no guarding.  Musculoskeletal: She exhibits no edema.  Neurological: She is alert.  Skin: Skin is warm and dry.  Psychiatric: She has a normal mood and affect.  Nursing note and vitals reviewed.    ED Treatments / Results  Labs (all labs ordered are listed, but only abnormal results are displayed) Labs Reviewed  COMPREHENSIVE METABOLIC PANEL - Abnormal; Notable for the following components:      Result Value   Sodium 131 (*)    Chloride 96 (*)    Total Protein 8.2 (*)    GFR calc non Af Amer 49 (*)    GFR calc Af Amer 56 (*)    All other components within normal limits  CBC - Abnormal; Notable for the following components:   WBC 3.9 (*)    Hemoglobin 11.4 (*)    HCT 35.5 (*)    RDW 15.9 (*)     All other components within normal limits  LIPASE, BLOOD  URINALYSIS, ROUTINE W REFLEX MICROSCOPIC  BRAIN NATRIURETIC PEPTIDE    EKG EKG Interpretation  Date/Time:  Monday July 11 2017 13:55:25 EDT Ventricular Rate:  75 PR Interval:  192 QRS Duration: 78 QT Interval:  388 QTC Calculation: 433 R Axis:   -38 Text Interpretation:  Normal sinus rhythm Left axis deviation Pulmonary disease pattern Nonspecific ST and T wave abnormality Abnormal ECG No significant change since last tracing Confirmed by Jacalyn Lefevre [phone]) on 07/11/2017 5:36:35 PM   Radiology Dg Chest 2 View  Result Date: 07/11/2017 CLINICAL DATA:  Shortness of breath for 3 days. No other chest complaints. EXAM: CHEST - 2 VIEW COMPARISON:  05/15/2017.  FINDINGS: Borderline cardiomegaly. Clear lung fields. Mild degenerative scoliosis. Thoracic atherosclerosis. Dolichoectatic innominate. IMPRESSION: Stable chest.  Disease Electronically Signed   By: Elsie StainJohn T Curnes M.D.   On: 07/11/2017 15:29   Ct Abdomen Pelvis W Contrast  Result Date: 07/11/2017 CLINICAL DATA:  84 year old female with abdominal pain. EXAM: CT ABDOMEN AND PELVIS WITH CONTRAST TECHNIQUE: Multidetector CT imaging of the abdomen and pelvis was performed using the standard protocol following bolus administration of intravenous contrast. CONTRAST:  100mL OMNIPAQUE IOHEXOL 300 MG/ML  SOLN COMPARISON:  Abdominal radiograph dated 05/15/2016 FINDINGS: Lower chest: The visualized lung bases are clear. There is mild cardiomegaly with multi vessel coronary vascular calcification. No intra-abdominal free air or free fluid. Hepatobiliary: No focal liver abnormality is seen. No gallstones, gallbladder wall thickening, or biliary dilatation. Pancreas: Unremarkable. No pancreatic ductal dilatation or surrounding inflammatory changes. Spleen: Normal in size without focal abnormality. Adrenals/Urinary Tract: The adrenal glands are unremarkable. There is a small area of cortical  infarct and scarring in the inferior pole of the right kidney. The kidneys are otherwise unremarkable. There is symmetric enhancement and excretion of contrast by both kidneys. The visualized ureters and urinary bladder appear unremarkable. Stomach/Bowel: There is moderate amount of stool throughout the colon. There is no bowel obstruction or active inflammation. The appendix is not visualized with certainty. No inflammatory changes identified in the right lower quadrant. Vascular/Lymphatic: Mild aortoiliac atherosclerotic disease. No portal venous gas. There is no adenopathy. Reproductive: The uterus is not well visualized, likely surgically absent or atrophic. The left ovary is grossly unremarkable. There is a 2 cm low attenuating structure in the region of the right ovary which is not well characterized on CT but likely represents a cyst. Ultrasound may provide better evaluation of the pelvic structures. Other: None Musculoskeletal: There is multilevel degenerative changes of the spine and multilevel facet arthropathy. No acute osseous pathology. IMPRESSION: 1. No acute intra-abdominal or pelvic pathology. No bowel obstruction or active inflammation. 2. Probable 2 cm right ovarian cyst. Electronically Signed   By: Elgie CollardArash  Radparvar M.D.   On: 07/11/2017 21:36    Procedures Procedures (including critical care time)  Medications Ordered in ED Medications  iohexol (OMNIPAQUE) 300 MG/ML solution 100 mL (100 mLs Intravenous Contrast Given 07/11/17 2100)     Initial Impression / Assessment and Plan / ED Course  I have reviewed the triage vital signs and the nursing notes.  Pertinent labs & imaging results that were available during my care of the patient were reviewed by me and considered in my medical decision making (see chart for details).     Debbie Ortiz is a [age] y.o. female with PMHx of DM, CHF who p/w generalized abd pain x 1 wk.  Reviewed and confirmed nursing documentation for past medical  history, family history, social history. VS afebrile, wnl. Exam remarkable for periumbilical tenderness, though distractible, no peritoneal signs. Ddx includes likely constipation/gas pains, consider diverticulitis.   EKG with no acute ischemia. CXR at baseline. BNP wnl. Lipase wnl. UA wnl. CMP with stable hyponatremia, otherwise unremarkable.  CBC with mild leukopenia 3.9, stable normocytic anemia with hgb 11.4, otherwise unremarkable. CT abd/pelvis w/ contrast with no acute findings. Discussed increased Miralax use.   Old records reviewed. Labs reviewed by me and used in the medical decision making.  Imaging viewed and interpreted by me and used in the medical decision making (formal interpretation from radiologist). EKG reviewed by me and used in the medical decision making. D/c home in stable condition,  return precautions discussed. Patient agreeable with plan for d/c home.    Final Clinical Impressions(s) / ED Diagnoses   Final diagnoses:  Generalized abdominal pain    ED Discharge Orders    None       Diannia Ruder, MD 07/12/17 0250    Melene Plan, DO 07/12/17 1525

## 2017-07-11 NOTE — ED Notes (Signed)
Called for room with no response 

## 2017-08-18 DIAGNOSIS — I639 Cerebral infarction, unspecified: Secondary | ICD-10-CM

## 2017-08-18 HISTORY — DX: Cerebral infarction, unspecified: I63.9

## 2017-12-18 ENCOUNTER — Inpatient Hospital Stay (HOSPITAL_COMMUNITY)
Admission: EM | Admit: 2017-12-18 | Discharge: 2018-01-18 | DRG: 061 | Disposition: E | Payer: Medicare Other | Attending: Neurology | Admitting: Neurology

## 2017-12-18 ENCOUNTER — Emergency Department (HOSPITAL_COMMUNITY): Payer: Medicare Other

## 2017-12-18 ENCOUNTER — Other Ambulatory Visit: Payer: Self-pay

## 2017-12-18 ENCOUNTER — Encounter (HOSPITAL_COMMUNITY): Payer: Self-pay | Admitting: Emergency Medicine

## 2017-12-18 ENCOUNTER — Inpatient Hospital Stay (HOSPITAL_COMMUNITY): Payer: Medicare Other

## 2017-12-18 DIAGNOSIS — G8194 Hemiplegia, unspecified affecting left nondominant side: Secondary | ICD-10-CM | POA: Diagnosis present

## 2017-12-18 DIAGNOSIS — Z79899 Other long term (current) drug therapy: Secondary | ICD-10-CM | POA: Diagnosis not present

## 2017-12-18 DIAGNOSIS — I110 Hypertensive heart disease with heart failure: Secondary | ICD-10-CM | POA: Diagnosis present

## 2017-12-18 DIAGNOSIS — E1165 Type 2 diabetes mellitus with hyperglycemia: Secondary | ICD-10-CM | POA: Diagnosis present

## 2017-12-18 DIAGNOSIS — E1159 Type 2 diabetes mellitus with other circulatory complications: Secondary | ICD-10-CM | POA: Diagnosis not present

## 2017-12-18 DIAGNOSIS — J690 Pneumonitis due to inhalation of food and vomit: Secondary | ICD-10-CM | POA: Diagnosis present

## 2017-12-18 DIAGNOSIS — H5347 Heteronymous bilateral field defects: Secondary | ICD-10-CM | POA: Diagnosis present

## 2017-12-18 DIAGNOSIS — G459 Transient cerebral ischemic attack, unspecified: Secondary | ICD-10-CM | POA: Diagnosis present

## 2017-12-18 DIAGNOSIS — E039 Hypothyroidism, unspecified: Secondary | ICD-10-CM | POA: Diagnosis present

## 2017-12-18 DIAGNOSIS — Z8679 Personal history of other diseases of the circulatory system: Secondary | ICD-10-CM

## 2017-12-18 DIAGNOSIS — Z66 Do not resuscitate: Secondary | ICD-10-CM | POA: Diagnosis present

## 2017-12-18 DIAGNOSIS — I63511 Cerebral infarction due to unspecified occlusion or stenosis of right middle cerebral artery: Secondary | ICD-10-CM | POA: Diagnosis not present

## 2017-12-18 DIAGNOSIS — Z8673 Personal history of transient ischemic attack (TIA), and cerebral infarction without residual deficits: Secondary | ICD-10-CM | POA: Diagnosis not present

## 2017-12-18 DIAGNOSIS — R29733 NIHSS score 33: Secondary | ICD-10-CM | POA: Diagnosis present

## 2017-12-18 DIAGNOSIS — Z993 Dependence on wheelchair: Secondary | ICD-10-CM | POA: Diagnosis not present

## 2017-12-18 DIAGNOSIS — I639 Cerebral infarction, unspecified: Secondary | ICD-10-CM | POA: Diagnosis present

## 2017-12-18 DIAGNOSIS — I63513 Cerebral infarction due to unspecified occlusion or stenosis of bilateral middle cerebral arteries: Principal | ICD-10-CM | POA: Diagnosis present

## 2017-12-18 DIAGNOSIS — E785 Hyperlipidemia, unspecified: Secondary | ICD-10-CM | POA: Diagnosis present

## 2017-12-18 DIAGNOSIS — Z8249 Family history of ischemic heart disease and other diseases of the circulatory system: Secondary | ICD-10-CM

## 2017-12-18 DIAGNOSIS — G936 Cerebral edema: Secondary | ICD-10-CM | POA: Diagnosis present

## 2017-12-18 DIAGNOSIS — Z515 Encounter for palliative care: Secondary | ICD-10-CM

## 2017-12-18 DIAGNOSIS — R4701 Aphasia: Secondary | ICD-10-CM | POA: Diagnosis present

## 2017-12-18 DIAGNOSIS — Z7902 Long term (current) use of antithrombotics/antiplatelets: Secondary | ICD-10-CM | POA: Diagnosis not present

## 2017-12-18 DIAGNOSIS — Z7984 Long term (current) use of oral hypoglycemic drugs: Secondary | ICD-10-CM | POA: Diagnosis not present

## 2017-12-18 DIAGNOSIS — J189 Pneumonia, unspecified organism: Secondary | ICD-10-CM

## 2017-12-18 DIAGNOSIS — I10 Essential (primary) hypertension: Secondary | ICD-10-CM | POA: Diagnosis not present

## 2017-12-18 DIAGNOSIS — Z7982 Long term (current) use of aspirin: Secondary | ICD-10-CM | POA: Diagnosis not present

## 2017-12-18 DIAGNOSIS — E119 Type 2 diabetes mellitus without complications: Secondary | ICD-10-CM

## 2017-12-18 DIAGNOSIS — Z79890 Hormone replacement therapy (postmenopausal): Secondary | ICD-10-CM

## 2017-12-18 DIAGNOSIS — I69351 Hemiplegia and hemiparesis following cerebral infarction affecting right dominant side: Secondary | ICD-10-CM

## 2017-12-18 DIAGNOSIS — I509 Heart failure, unspecified: Secondary | ICD-10-CM | POA: Diagnosis present

## 2017-12-18 HISTORY — DX: Cerebral infarction, unspecified: I63.9

## 2017-12-18 HISTORY — DX: Transient cerebral ischemic attack, unspecified: G45.9

## 2017-12-18 LAB — CBC
HCT: 40.6 % (ref 36.0–46.0)
HEMOGLOBIN: 12.7 g/dL (ref 12.0–15.0)
MCH: 26.1 pg (ref 26.0–34.0)
MCHC: 31.3 g/dL (ref 30.0–36.0)
MCV: 83.5 fL (ref 80.0–100.0)
Platelets: 240 10*3/uL (ref 150–400)
RBC: 4.86 MIL/uL (ref 3.87–5.11)
RDW: 14.2 % (ref 11.5–15.5)
WBC: 6.7 10*3/uL (ref 4.0–10.5)
nRBC: 0 % (ref 0.0–0.2)

## 2017-12-18 LAB — GLUCOSE, CAPILLARY
Glucose-Capillary: 216 mg/dL — ABNORMAL HIGH (ref 70–99)
Glucose-Capillary: 204 mg/dL — ABNORMAL HIGH (ref 70–99)

## 2017-12-18 LAB — COMPREHENSIVE METABOLIC PANEL
ALT: 24 U/L (ref 0–44)
AST: 38 U/L (ref 15–41)
Albumin: 3.6 g/dL (ref 3.5–5.0)
Alkaline Phosphatase: 111 U/L (ref 38–126)
Anion gap: 13 (ref 5–15)
BILIRUBIN TOTAL: 0.6 mg/dL (ref 0.3–1.2)
BUN: 14 mg/dL (ref 8–23)
CO2: 19 mmol/L — ABNORMAL LOW (ref 22–32)
Calcium: 9.7 mg/dL (ref 8.9–10.3)
Chloride: 104 mmol/L (ref 98–111)
Creatinine, Ser: 0.81 mg/dL (ref 0.44–1.00)
GFR calc Af Amer: >60 mL/min (ref >60)
GFR calc non Af Amer: >60 mL/min (ref >60)
Glucose, Bld: 208 mg/dL — ABNORMAL HIGH (ref 70–99)
Potassium: 3.9 mmol/L (ref 3.5–5.1)
Sodium: 136 mmol/L (ref 135–145)
Total Protein: 7.5 g/dL (ref 6.5–8.1)

## 2017-12-18 LAB — DIFFERENTIAL
Abs Immature Granulocytes: 0.03 10*3/uL (ref 0.00–0.07)
Basophils Absolute: 0 10*3/uL (ref 0.0–0.1)
Basophils Relative: 0 %
Eosinophils Absolute: 0 10*3/uL (ref 0.0–0.5)
Eosinophils Relative: 1 %
Immature Granulocytes: 1 %
LYMPHS ABS: 1.6 10*3/uL (ref 0.7–4.0)
LYMPHS PCT: 24 %
Monocytes Absolute: 0.5 10*3/uL (ref 0.1–1.0)
Monocytes Relative: 8 %
NEUTROS PCT: 66 %
Neutro Abs: 4.4 10*3/uL (ref 1.7–7.7)

## 2017-12-18 LAB — I-STAT CHEM 8, ED
BUN: 15 mg/dL (ref 8–23)
CALCIUM ION: 1.11 mmol/L — AB (ref 1.15–1.40)
Chloride: 109 mmol/L (ref 98–111)
Creatinine, Ser: 0.6 mg/dL (ref 0.44–1.00)
GLUCOSE: 208 mg/dL — AB (ref 70–99)
HCT: 39 % (ref 36.0–46.0)
Hemoglobin: 13.3 g/dL (ref 12.0–15.0)
Potassium: 3.9 mmol/L (ref 3.5–5.1)
SODIUM: 136 mmol/L (ref 135–145)
TCO2: 20 mmol/L — AB (ref 22–32)

## 2017-12-18 LAB — PROTIME-INR
INR: 1.05
PROTHROMBIN TIME: 13.6 s (ref 11.4–15.2)

## 2017-12-18 LAB — I-STAT TROPONIN, ED: Troponin i, poc: 0.5 ng/mL (ref 0.00–0.08)

## 2017-12-18 LAB — ETHANOL: Alcohol, Ethyl (B): <10 mg/dL (ref <10)

## 2017-12-18 LAB — APTT: aPTT: 24 seconds (ref 24–36)

## 2017-12-18 LAB — CBG MONITORING, ED: Glucose-Capillary: 187 mg/dL — ABNORMAL HIGH (ref 70–99)

## 2017-12-18 LAB — MRSA PCR SCREENING: MRSA by PCR: NEGATIVE

## 2017-12-18 MED ORDER — ORAL CARE MOUTH RINSE
15.0000 mL | Freq: Two times a day (BID) | OROMUCOSAL | Status: DC
  Administered 2017-12-18 – 2017-12-20 (×4): 15 mL via OROMUCOSAL

## 2017-12-18 MED ORDER — ACETAMINOPHEN 160 MG/5ML PO SOLN: 650.0000 mg | ORAL | Status: DC | PRN

## 2017-12-18 MED ORDER — CLEVIDIPINE BUTYRATE 0.5 MG/ML IV EMUL
0.0000 mg/h | INTRAVENOUS | Status: DC
  Administered 2017-12-18: 2 mg/h via INTRAVENOUS
  Administered 2017-12-19: 3 mg/h via INTRAVENOUS
  Filled 2017-12-18 (×3): qty 50

## 2017-12-18 MED ORDER — ALTEPLASE (STROKE) FULL DOSE INFUSION
0.9000 mg/kg | Freq: Once | INTRAVENOUS | Status: AC
  Administered 2017-12-18: 61.7 mg via INTRAVENOUS
  Filled 2017-12-18: qty 100

## 2017-12-18 MED ORDER — ACETAMINOPHEN 650 MG RE SUPP: 650.0000 mg | RECTAL | Status: DC | PRN

## 2017-12-18 MED ORDER — PANTOPRAZOLE SODIUM 40 MG IV SOLR
40.0000 mg | Freq: Every day | INTRAVENOUS | Status: DC
  Administered 2017-12-18: 40 mg via INTRAVENOUS
  Filled 2017-12-18: qty 40

## 2017-12-18 MED ORDER — INFLUENZA VAC SPLIT HIGH-DOSE 0.5 ML IM SUSY
0.5000 mL | PREFILLED_SYRINGE | INTRAMUSCULAR | Status: DC
  Filled 2017-12-18: qty 0.5

## 2017-12-18 MED ORDER — IOPAMIDOL (ISOVUE-370) INJECTION 76%
90.0000 mL | Freq: Once | INTRAVENOUS | Status: AC | PRN
  Administered 2017-12-18: 90 mL via INTRAVENOUS

## 2017-12-18 MED ORDER — ACETAMINOPHEN 325 MG PO TABS: 650.0000 mg | ORAL_TABLET | ORAL | Status: DC | PRN

## 2017-12-18 MED ORDER — LABETALOL HCL 5 MG/ML IV SOLN
20.0000 mg | Freq: Once | INTRAVENOUS | Status: AC
  Administered 2017-12-18: 20 mg via INTRAVENOUS
  Filled 2017-12-18: qty 4

## 2017-12-18 MED ORDER — SODIUM CHLORIDE 0.9 % IV SOLN
INTRAVENOUS | Status: DC
  Administered 2017-12-18 (×2): via INTRAVENOUS

## 2017-12-18 MED ORDER — STROKE: EARLY STAGES OF RECOVERY BOOK
Freq: Once | Status: AC
  Administered 2017-12-18: 18:00:00
  Filled 2017-12-18 (×2): qty 1

## 2017-12-18 MED ORDER — SODIUM CHLORIDE 0.9 % IV SOLN: 50.0000 mL | Freq: Once | INTRAVENOUS | Status: DC

## 2017-12-18 MED ORDER — INSULIN ASPART 100 UNIT/ML ~~LOC~~ SOLN
0.0000 [IU] | Freq: Three times a day (TID) | SUBCUTANEOUS | Status: DC
  Administered 2017-12-18 – 2017-12-19 (×2): 3 [IU] via SUBCUTANEOUS

## 2017-12-18 MED ORDER — SENNOSIDES-DOCUSATE SODIUM 8.6-50 MG PO TABS: 1.0000 | ORAL_TABLET | Freq: Every evening | ORAL | Status: DC | PRN

## 2017-12-18 NOTE — Progress Notes (Signed)
Pt arrived to 4N16 at 1600.

## 2017-12-18 NOTE — Progress Notes (Signed)
Mrs. Daryll BrodJulia Stamour is a 84 yo female with a hx of CHF, DM, HTN, Stroke, TIA. [facility] at [facility]. LKW 1230, CBG 187, NIH 33. TPA started

## 2017-12-18 NOTE — ED Provider Notes (Signed)
[facility] EMERGENCY DEPARTMENT Provider Note   CSN: [id number] Arrival date & time: 12/28/2017  1351   Level  History   Chief Complaint Chief Complaint  Patient presents with  . Code Stroke   5 caveat altered mental status, acuity of situation.  History is obtained from records accompanying patient and from Ms. Mayfield, nursing at skilled nursing facility HPI Debbie Ortiz is a 84 y.o. female.  HPI Patient last normal 12:30 PM today when she arrived back from lunch to find her not talkative.  Less alert than normal.  Patient brought by EMS.  No treatment prior to coming here.. Past Medical History:  Diagnosis Date  . Arthritis   . CHF (congestive heart failure) (HCC)   . Diabetes mellitus without complication (HCC)   . Hypertension   . Stroke (HCC) 08/2017   R sided deficits  . Thyroid disease   . TIA (transient ischemic attack)     Patient Active Problem List   Diagnosis Date Noted  . Palliative care by specialist   . DNR (do not resuscitate)   . Weakness generalized   . TIA (transient ischemic attack) 05/15/2017  . Acute hyponatremia 05/15/2017  . Hypertension 05/15/2017  . Hypothyroidism 05/15/2017  . History of CHF (congestive heart failure) 05/15/2017  . History of urinary retention 05/15/2017  . Diabetes mellitus without complication (HCC) 05/15/2017  . Elevated troponin 05/15/2017    History reviewed. No pertinent surgical history.   OB History   None      Home Medications    Prior to Admission medications   Medication Sig Start Date End Date Taking? Authorizing Provider  aspirin 81 MG chewable tablet Chew 81 mg by mouth daily.     [provider]  atorvastatin (LIPITOR) 10 MG tablet Take 10 mg by mouth daily.     [provider]  clopidogrel (PLAVIX) 75 MG tablet Take 75 mg by mouth daily.    [provider]  diazepam (VALIUM) 5 MG tablet Take 1.25 mg by mouth 2 (two) times daily.     [provider]  furosemide (LASIX) 40 MG tablet Take 40 mg by mouth 2 (two) times daily.    [provider]  levothyroxine (SYNTHROID, LEVOTHROID) 75 MCG tablet Take 75 mcg by mouth daily before breakfast.    [provider]  losartan (COZAAR) 100 MG tablet Take 100 mg by mouth daily.    [provider]  meloxicam (MOBIC) 7.5 MG tablet Take 7.5 mg by mouth daily.    [provider]  metFORMIN (GLUCOPHAGE) 500 MG tablet Take 500 mg by mouth daily with breakfast.    [provider]  metoprolol tartrate (LOPRESSOR) 25 MG tablet Take 12.5 mg by mouth 2 (two) times daily.     [provider]  nitroGLYCERIN (NITROSTAT) 0.4 MG SL tablet Place 0.4 mg under the tongue every 5 (five) minutes as needed for chest pain.    [provider]  polyethylene glycol (MIRALAX / GLYCOLAX) packet Take 17 g by mouth daily.    [provider]  spironolactone (ALDACTONE) 25 MG tablet Take 25 mg by mouth daily.    [provider]  vitamin B-12 (CYANOCOBALAMIN) 500 MCG tablet Take 1 tablet (500 mcg total) by mouth daily. 05/21/17   Rometta Emery, MD    Family History Family History  Problem Relation Age of Onset  . Heart disease Father        details not clear  Social History Social History   Tobacco Use  . Smoking status: Never Smoker  . Smokeless tobacco: Never Used  Substance Use Topics  . Alcohol use: Not Currently  . Drug use: Never     Allergies   Codeine; Sulfa antibiotics; and Tape   Review of Systems Review of Systems  Unable to perform ROS: Acuity of condition     Physical Exam Updated Vital Signs BP (!) 158/95   Pulse 80   Temp (!) 97 F (36.1 C) (Axillary)   Resp 15   Wt 68.5 kg   SpO2 98%   BMI 25.92 kg/m   Physical Exam  Constitutional:  Ill-appearing.  Minimal nonpurposeful movement to noxious stimulus  HENT:  Head: Normocephalic and atraumatic.  Eyes:  Both eyes deviated to right    Neck: Neck supple. No tracheal deviation present. No thyromegaly present.  Cardiovascular: Normal rate and regular rhythm.  No murmur heard. Pulmonary/Chest: Effort normal and breath sounds normal.  Abdominal: Soft. Bowel sounds are normal. She exhibits no distension. There is no tenderness.  Musculoskeletal: Normal range of motion. She exhibits no edema or tenderness.  Neurological: Coordination normal.  Not follow commands.  Minimal nonpurposeful movement to painful stimulus.  DTRs symmetric bilaterally at knee jerk ankle jerk and biceps toes upward going bilaterally.  Skin: Skin is warm and dry. No rash noted.  Psychiatric: She has a normal mood and affect.  Nursing note and vitals reviewed.    ED Treatments / Results  Labs (all labs ordered are listed, but only abnormal results are displayed) Labs Reviewed  I-STAT CHEM 8, ED - Abnormal; Notable for the following components:      Result Value   Glucose, Bld 208 (*)    Calcium, Ion 1.11 (*)    TCO2 20 (*)    All other components within normal limits  I-STAT TROPONIN, ED - Abnormal; Notable for the following components:   Troponin i, poc 0.50 (*)    All other components within normal limits  CBG MONITORING, ED - Abnormal; Notable for the following components:   Glucose-Capillary 187 (*)    All other components within normal limits  PROTIME-INR  APTT  CBC  DIFFERENTIAL  ETHANOL  COMPREHENSIVE METABOLIC PANEL  RAPID URINE DRUG SCREEN, HOSP PERFORMED  URINALYSIS, ROUTINE W REFLEX MICROSCOPIC    EKG EKG Interpretation  Date/Time:  Sunday December 18 2017 13:56:27 EST Ventricular Rate:  73 PR Interval:    QRS Duration: 97 QT Interval:  378 QTC Calculation: 417 R Axis:   -21 Text Interpretation:  Sinus rhythm Borderline left axis deviation Nonspecific T abnormalities, lateral leads No significant change since last tracing Confirmed by Doug Sou [phone]) on 01/12/2018 3:02:57 PM   Radiology Ct Head Code Stroke Wo  Contrast  Result Date: 01/16/2018 CLINICAL DATA:  Code stroke.  Right-sided gaze EXAM: CT HEAD WITHOUT CONTRAST TECHNIQUE: Contiguous axial images were obtained from the base of the skull through the vertex without intravenous contrast. COMPARISON:  CT head 09/01/2017 FINDINGS: Brain: Chronic infarct left posterior frontal and parietal lobe MCA distribution, with encephalomalacia and hypodensity Subtle hypodensity in the right frontal and parietal cortex which could be acute infarct. No definite edema in the basal ganglia. Negative for hemorrhage or mass.  Mild atrophy Vascular: Mild hyperdensity right MCA which could be calcified plaque or possibly thrombus. This was not present previously. Skull: Negative Sinuses/Orbits: Bilateral cataract surgery. Mild mucosal edema paranasal sinuses Other: None ASPECTS (Alberta Stroke Program Early CT Score) - Ganglionic  level infarction (caudate, lentiform nuclei, internal capsule, insula, M1-M3 cortex): 6 - Supraganglionic infarction (M4-M6 cortex): 1 Total score (0-10 with 10 being normal): 7 IMPRESSION: 1. Possible acute right MCA infarct. This is not definite and could be due to scan quality. 2. Possible dense right MCA versus calcific plaque but not identified on the prior CT. 3. ASPECTS is 7 4. These results were called by telephone at the time of interpretation on 2018-01-10 at 2:37 pm to Dr. Amada Jupiter , who verbally acknowledged these results. Electronically Signed   By: Marlan Palau M.D.   On: January 10, 2018 14:38    Procedures Procedures (including critical care time)  Medications Ordered in ED Medications  iopamidol (ISOVUE-370) 76 % injection 90 mL (90 mLs Intravenous Contrast Given 01-10-2018 1435)   Results for orders placed or performed during the hospital encounter of 2018/01/10  Ethanol  Result Value Ref Range   Alcohol, Ethyl (B) <10 <10 mg/dL  Protime-INR  Result Value Ref Range   Prothrombin Time 13.6 11.4 - 15.2 seconds   INR 1.05   APTT    Result Value Ref Range   aPTT 24 24 - 36 seconds  CBC  Result Value Ref Range   WBC 6.7 4.0 - 10.5 K/uL   RBC 4.86 3.87 - 5.11 MIL/uL   Hemoglobin 12.7 12.0 - 15.0 g/dL   HCT 16.1 09.6 - 04.5 %   MCV 83.5 80.0 - 100.0 fL   MCH 26.1 26.0 - 34.0 pg   MCHC 31.3 30.0 - 36.0 g/dL   RDW 40.9 81.1 - 91.4 %   Platelets 240 150 - 400 K/uL   nRBC 0.0 0.0 - 0.2 %  Differential  Result Value Ref Range   Neutrophils Relative % 66 %   Neutro Abs 4.4 1.7 - 7.7 K/uL   Lymphocytes Relative 24 %   Lymphs Abs 1.6 0.7 - 4.0 K/uL   Monocytes Relative 8 %   Monocytes Absolute 0.5 0.1 - 1.0 K/uL   Eosinophils Relative 1 %   Eosinophils Absolute 0.0 0.0 - 0.5 K/uL   Basophils Relative 0 %   Basophils Absolute 0.0 0.0 - 0.1 K/uL   Immature Granulocytes 1 %   Abs Immature Granulocytes 0.03 0.00 - 0.07 K/uL  Comprehensive metabolic panel  Result Value Ref Range   Sodium 136 135 - 145 mmol/L   Potassium 3.9 3.5 - 5.1 mmol/L   Chloride 104 98 - 111 mmol/L   CO2 19 (L) 22 - 32 mmol/L   Glucose, Bld 208 (H) 70 - 99 mg/dL   BUN 14 8 - 23 mg/dL   Creatinine, Ser 7.82 0.44 - 1.00 mg/dL   Calcium 9.7 8.9 - 95.6 mg/dL   Total Protein 7.5 6.5 - 8.1 g/dL   Albumin 3.6 3.5 - 5.0 g/dL   AST 38 15 - 41 U/L   ALT 24 0 - 44 U/L   Alkaline Phosphatase 111 38 - 126 U/L   Total Bilirubin 0.6 0.3 - 1.2 mg/dL   GFR calc non Af Amer >60 >60 mL/min   GFR calc Af Amer >60 >60 mL/min   Anion gap 13 5 - 15  I-Stat Chem 8, ED  Result Value Ref Range   Sodium 136 135 - 145 mmol/L   Potassium 3.9 3.5 - 5.1 mmol/L   Chloride 109 98 - 111 mmol/L   BUN 15 8 - 23 mg/dL   Creatinine, Ser 2.13 0.44 - 1.00 mg/dL   Glucose, Bld 086 (H) 70 - 99  mg/dL   Calcium, Ion 1.611.11 (L) 1.15 - 1.40 mmol/L   TCO2 20 (L) 22 - 32 mmol/L   Hemoglobin 13.3 12.0 - 15.0 g/dL   HCT 09.639.0 04.536.0 - 40.946.0 %  I-stat troponin, ED  Result Value Ref Range   Troponin i, poc 0.50 (HH) 0.00 - 0.08 ng/mL   Comment NOTIFIED PHYSICIAN    Comment 3           CBG monitoring, ED  Result Value Ref Range   Glucose-Capillary 187 (H) 70 - 99 mg/dL   Ct Head Code Stroke Wo Contrast  Result Date: 01/16/2018 CLINICAL DATA:  Code stroke.  Right-sided gaze EXAM: CT HEAD WITHOUT CONTRAST TECHNIQUE: Contiguous axial images were obtained from the base of the skull through the vertex without intravenous contrast. COMPARISON:  CT head 09/01/2017 FINDINGS: Brain: Chronic infarct left posterior frontal and parietal lobe MCA distribution, with encephalomalacia and hypodensity Subtle hypodensity in the right frontal and parietal cortex which could be acute infarct. No definite edema in the basal ganglia. Negative for hemorrhage or mass.  Mild atrophy Vascular: Mild hyperdensity right MCA which could be calcified plaque or possibly thrombus. This was not present previously. Skull: Negative Sinuses/Orbits: Bilateral cataract surgery. Mild mucosal edema paranasal sinuses Other: None ASPECTS (Alberta Stroke Program Early CT Score) - Ganglionic level infarction (caudate, lentiform nuclei, internal capsule, insula, M1-M3 cortex): 6 - Supraganglionic infarction (M4-M6 cortex): 1 Total score (0-10 with 10 being normal): 7 IMPRESSION: 1. Possible acute right MCA infarct. This is not definite and could be due to scan quality. 2. Possible dense right MCA versus calcific plaque but not identified on the prior CT. 3. ASPECTS is 7 4. These results were called by telephone at the time of interpretation on 01/14/2018 at 2:37 pm to Dr. Amada JupiterKirkpatrick , who verbally acknowledged these results. Electronically Signed   By: Marlan Palauharles  Clark M.D.   On: 12/22/2017 14:38     Initial Impression / Assessment and Plan / ED Course  I have reviewed the triage vital signs and the nursing notes.  Pertinent labs & imaging results that were available during my care of the patient were reviewed by me and considered in my medical decision making (see chart for details).     Code stroke called by me Confirmed  with patient's niece and with most form patient is DNR CODE STATUS  Dr. Amada JupiterKirkpatrick from stroke service came to evaluate patient and deemed that she is a candidate for TPA after discussion with family.  Patient will be admitted to the intensive care unit  NIH stroke scale calculated at 9533 by stroke team.  Upper work remarkable for hyperglycemia Final Clinical Impressions(s) / ED Diagnoses  Dx #1 acute ischemic stroke #2 hyperglycemia Final diagnoses:  None  CRITICAL CARE Performed by: Doug SouSam Kaianna Dolezal Total critical care time: 40 minutes Critical care time was exclusive of separately billable procedures and treating other patients. Critical care was necessary to treat or prevent imminent or life-threatening deterioration. Critical care was time spent personally by me on the following activities: development of treatment plan with patient and/or surrogate as well as nursing, discussions with consultants, evaluation of patient's response to treatment, examination of patient, obtaining history from patient or surrogate, ordering and performing treatments and interventions, ordering and review of laboratory studies, ordering and review of radiographic studies, pulse oximetry and re-evaluation of patient's condition.  ED Discharge Orders    None       Doug SouJacubowitz, Demarri Elie, MD 01/12/2018 1505

## 2017-12-18 NOTE — ED Triage Notes (Signed)
Per [facility]: Patient to ED [facility] for altered mental status. Patient normally alert and oriented x 4 according to family and nurse at [facility] - she was last seen at 1230 at her baseline. Between then and when EMS was called (unknown time of when symptoms were noticed), patient became nonverbal and had R gaze. Upon arrival to ED, patient moaning, not moving extremities, and not following commands. R gaze noted, complete hemianopia, and increased tone to L arm. Patient had CVA with R-sided deficits 4 months ago (seen at [facility]). No movement in R arm and bilateral legs initially.   EMS VS: 158/88, HR 67 sinus rhythm, RR 16, 96% RA, CBG 226. 20g. PIV L wrist

## 2017-12-18 NOTE — H&P (Addendum)
NEURO HOSPITALIST  H&P   Chief Complaint: AMS  History obtained from: Chart/ family  HPI:                                                                                                                                         Debbie Ortiz is an 84 y.o. female with PMH CVA(08/2017 with residual right side deficit), TIA, CHF, DM, HTN, TIA who [facility] with c/o AMS. Code stroke was called in CT.    per family they last saw her last Wednesday when she was her baseline. At baseline patient was able to talk, had been working with therapy and was able to assist with transfers. She could also feed herself. The facility said they last saw her well at 1230 and she was talking. They later went to check on her and she was not talking. EMS was called.   ED course:  CTH: no hemorrhage. CTA: right M1 occlusion, chronic Left MCA infarct CTP: CBF 50 ML BP: 149/68, BG: 187  Chart review of past stroke:  08/2017: at [facility]: Left MCA infarct with residual right side deficit. She did receive TPA and thrombectomy at that time. Right field cut.  Date last known well: Date: Jul 10, 2017 Time last known well: Time: 12:30 tPA Given: Yes; bolus started @ 1451 Modified Rankin: Rankin Score=4 NIHSS:33    Past Medical History:  Diagnosis Date  . Arthritis   . CHF (congestive heart failure) (HCC)   . Diabetes mellitus without complication (HCC)   . Hypertension   . Stroke (HCC) 08/2017   R sided deficits  . Thyroid disease   . TIA (transient ischemic attack)     History reviewed. No pertinent surgical history.  Family History  Problem Relation Age of Onset  . Heart disease Father        details not clear         Social History:  reports that she has never smoked. She has never used smokeless tobacco. She reports that she drank alcohol. She reports that she does not use  drugs.  Allergies:  Allergies  Allergen Reactions  . Codeine Other (See Comments)    Reaction (??)  . Sulfa Antibiotics Other (See Comments)    Reaction (??)  . Tape Other (See Comments)    Per family, patient tolerates Coban wrap well    Medications:  Current Facility-Administered Medications  Medication Dose Route Frequency Provider Last Rate Last Dose  .  stroke: mapping our early stages of recovery book   Does not apply Once Williams, Jessica N, NP      . alteplase (ACTIVASE) 1 mg/mL infusion 61.7 mg  0.9 mg/kg Intravenous Once Rejeana Brock, MD 61.7 mL/hr at 12/29/2017 1451 61.7 mg at 01/01/2018 1451   Followed by  . 0.9 %  sodium chloride infusion  50 mL Intravenous Once Rejeana Brock, MD      . 0.9 %  sodium chloride infusion   Intravenous Continuous Arline Asp, NP 75 mL/hr at 01/01/2018 1506    . acetaminophen (TYLENOL) tablet 650 mg  650 mg Oral Q4H PRN Arline Asp, NP       Or  . acetaminophen (TYLENOL) solution 650 mg  650 mg Per Tube Q4H PRN Arline Asp, NP       Or  . acetaminophen (TYLENOL) suppository 650 mg  650 mg Rectal Q4H PRN Arline Asp, NP      . labetalol (NORMODYNE,TRANDATE) injection 20 mg  20 mg Intravenous Once Arline Asp, NP       And  . clevidipine (CLEVIPREX) infusion 0.5 mg/mL  0-21 mg/hr Intravenous Continuous Arline Asp, NP      . pantoprazole (PROTONIX) injection 40 mg  40 mg Intravenous QHS Arline Asp, NP      . senna-docusate (Senokot-S) tablet 1 tablet  1 tablet Oral QHS PRN Arline Asp, NP       Current Outpatient Medications  Medication Sig Dispense Refill  . aspirin 81 MG chewable tablet Chew 81 mg by mouth daily.     Marland Kitchen atorvastatin (LIPITOR) 10 MG tablet Take 10 mg by mouth daily.     . clopidogrel (PLAVIX) 75 MG tablet Take 75 mg by mouth  daily.    . diazepam (VALIUM) 5 MG tablet Take 1.25 mg by mouth 2 (two) times daily.     . furosemide (LASIX) 40 MG tablet Take 40 mg by mouth 2 (two) times daily.    Marland Kitchen levothyroxine (SYNTHROID, LEVOTHROID) 75 MCG tablet Take 75 mcg by mouth daily before breakfast.    . losartan (COZAAR) 100 MG tablet Take 100 mg by mouth daily.    . meloxicam (MOBIC) 7.5 MG tablet Take 7.5 mg by mouth daily.    . metFORMIN (GLUCOPHAGE) 500 MG tablet Take 500 mg by mouth daily with breakfast.    . metoprolol tartrate (LOPRESSOR) 25 MG tablet Take 12.5 mg by mouth 2 (two) times daily.     . nitroGLYCERIN (NITROSTAT) 0.4 MG SL tablet Place 0.4 mg under the tongue every 5 (five) minutes as needed for chest pain.    . polyethylene glycol (MIRALAX / GLYCOLAX) packet Take 17 g by mouth daily.    Marland Kitchen spironolactone (ALDACTONE) 25 MG tablet Take 25 mg by mouth daily.    . vitamin B-12 (CYANOCOBALAMIN) 500 MCG tablet Take 1 tablet (500 mcg total) by mouth daily.       ROS:  unobtainable from patient due to mental status  General Examination:                                                                                                      Blood pressure (!) 158/95, pulse 80, temperature (!) 97 F (36.1 C), temperature source Axillary, resp. rate 15, weight 68.5 kg, SpO2 98 %.  HEENT-  Normocephalic, no lesions, without obvious abnormality.  Normal external eye and conjunctiva. Cardiovascular- S1-S2 audible, pulses palpable throughout  Lungs-no rhonchi or wheezing noted, no excessive working breathing.  Saturations within normal limits on RA Abdomen- All 4 quadrants palpated and non tender Extremities- Warm, dry and intact Musculoskeletal-no joint tenderness, deformity or swelling Skin-warm and dry, no hyperpigmentation, vitiligo, or suspicious lesions  Neurological  Examination Mental Status: Alert, eyes opened, not oriented. Aphasic, not following commands.  Cranial Nerves: Does not blink to threat from any direction. Complete hemianopsia. Right gaze preference. Neglecting left side. Motor/Sensory: Increased tone in LUE. Seh does not move either side well, but withdraws in the RUE Responds to painful stimuli in all 4 extremities. Cerebellar: UTA Gait: UTA   Lab Results: Basic Metabolic Panel: Recent Labs  Lab 12/20/2017 1416 2017-12-20 1418  NA 136 136  K 3.9 3.9  CL 104 109  CO2 19*  --   GLUCOSE 208* 208*  BUN 14 15  CREATININE 0.81 0.60  CALCIUM 9.7  --     CBC: Recent Labs  Lab 12-20-2017 1416 2017/12/20 1418  WBC 6.7  --   NEUTROABS 4.4  --   HGB 12.7 13.3  HCT 40.6 39.0  MCV 83.5  --   PLT 240  --     CBG: Recent Labs  Lab Dec 20, 2017 1425  GLUCAP 187*    Imaging: Ct Head Code Stroke Wo Contrast  Result Date: 12-20-17 CLINICAL DATA:  Code stroke.  Right-sided gaze EXAM: CT HEAD WITHOUT CONTRAST TECHNIQUE: Contiguous axial images were obtained from the base of the skull through the vertex without intravenous contrast. COMPARISON:  CT head 09/01/2017 FINDINGS: Brain: Chronic infarct left posterior frontal and parietal lobe MCA distribution, with encephalomalacia and hypodensity Subtle hypodensity in the right frontal and parietal cortex which could be acute infarct. No definite edema in the basal ganglia. Negative for hemorrhage or mass.  Mild atrophy Vascular: Mild hyperdensity right MCA which could be calcified plaque or possibly thrombus. This was not present previously. Skull: Negative Sinuses/Orbits: Bilateral cataract surgery. Mild mucosal edema paranasal sinuses Other: None ASPECTS (Alberta Stroke Program Early CT Score) - Ganglionic level infarction (caudate, lentiform nuclei, internal capsule, insula, M1-M3 cortex): 6 - Supraganglionic infarction (M4-M6 cortex): 1 Total score (0-10 with 10 being normal): 7 IMPRESSION: 1.  Possible acute right MCA infarct. This is not definite and could be due to scan quality. 2. Possible dense right MCA versus calcific plaque but not identified on the prior CT. 3. ASPECTS is 7 4. These results were called by telephone at the time of interpretation on 12/20/17 at 2:37 pm to Dr. Amada Jupiter , who verbally acknowledged these results. Electronically Signed  By: Marlan Palau M.D.   On: 01/14/2018 14:38      Assessment: 84 y.o. female with PMH CVA(08/2017 with residual right side deficit), TIA, CHF, DM, HTN, TIA who presented to [facility] ED from [facility] with c/o AMS. Code stroke was called in CT. CTH: no hemorrhage. CTA: right M1 occlusion. TPA given.  Stroke Risk Factors - diabetes mellitus and hypertension    Plan: -- BP goal : Permissive HTN upto  180/118mmHg,  --MRI Brain  --CTA done --Echocardiogram -- ASA ( not today) -- High intensity Statin if LDL > 70  -- HgbA1c, fasting lipid panel -- PT consult, OT consult, Speech consult --Telemetry monitoring --Frequent neuro checks --Stroke swallow screen  CNS -Close neuro monitoring  RESP -monitor -no acute issues  CV -Aggressive BP control, goal SBP < 180/105 -Titrate oral agents -labetalol/ cleviprex  GI/GU Gentle hydration  ENDO Type 2 diabetes mellitus with hyperglycemia  -SSI -goal HgbA1c < 7  ID Possible Aspiration PNA -CXR -NPO -Monitor  Prophylaxis DVT: SCD GI: doc/senna    Dispo: TBD  Diet: NPO until cleared by speech   Code Status:  DNR    Valentina Lucks, MSN, NP-C [facility] [phone]   01/07/2018, 2:59 PM   Attending physician note to follow with Assessment and plan .  I have seen the patient and reviewed the above note.    She has a large right MCA infarct, and is here within the time window for IV TPA and therefore received this.  I called the nursing home to confirm her time of last seen well.  I then discussed  the risks and benefits of IV TPA with the family who agreed with pursuing it.  I also discussed other options such as comfort care, and they favored pursuing TPA at this time.  I broached the possibility that if she does not improve that comfort care might be a viable option.  She has bilateral weakness, right gaze does not blink to threat from either direction.  She is being given IV TPA and will be admitted for stroke work-up/post TPA management.  If she does not improve, I think a palliative care should be involved.  This patient is critically ill and at significant risk of neurological worsening, death and care requires constant monitoring of vital signs, hemodynamics,respiratory and cardiac monitoring, neurological assessment, discussion with family, other specialists and medical decision making of high complexity. I spent 50 minutes of neurocritical care time  in the care of  this patient.  This is independent of any time spent by the nurse practitioner.  Ritta Slot, MD [facility] [phone]  If 7pm- 7am, please page neurology on call as listed in AMION. 01/01/2018  5:08 PM

## 2017-12-18 NOTE — ED Notes (Signed)
Patient's great-niece at bedside - states patient is normally alert and talking. They saw her this past Wednesday and she was at baseline then. Last known well, according to [facility] staff, patient was seen at her baseline at 1230 - she was alert and talking at that time.

## 2017-12-18 NOTE — ED Notes (Signed)
Patient to CT.

## 2017-12-18 NOTE — Progress Notes (Signed)
Pt has worsening neurological exam, no longer following commands and reduced motor response in LUE and LLE. Neuro MD notified, stat head CT ordered.

## 2017-12-18 NOTE — ED Notes (Signed)
Dr.Jacbowitz made aware of pt I-stat troponin level. ED-Lab

## 2017-12-18 NOTE — ED Notes (Signed)
TPA started in CT at 1451.

## 2017-12-18 NOTE — ED Notes (Signed)
RN transporting patient to [address] at this time.

## 2017-12-18 NOTE — Progress Notes (Signed)
PHARMACIST CODE STROKE RESPONSE  Notified to mix tPA at 1447 by Dr. Amada JupiterKirkpatrick Delivered tPA to RN at 1449  tPA dose = 6.2mg  bolus over 1 minute followed by 55.4mg  for a total dose of 61.6mg  over 1 hour  Issues/delays encountered (if applicable): none   Takeysha Bonk D. Laney Potashang, PharmD, BCPS, BCCCP 12/20/2017, 2:58 PM

## 2017-12-19 ENCOUNTER — Inpatient Hospital Stay (HOSPITAL_COMMUNITY): Payer: Medicare Other

## 2017-12-19 DIAGNOSIS — I63511 Cerebral infarction due to unspecified occlusion or stenosis of right middle cerebral artery: Secondary | ICD-10-CM

## 2017-12-19 DIAGNOSIS — I10 Essential (primary) hypertension: Secondary | ICD-10-CM

## 2017-12-19 DIAGNOSIS — E785 Hyperlipidemia, unspecified: Secondary | ICD-10-CM

## 2017-12-19 DIAGNOSIS — E1159 Type 2 diabetes mellitus with other circulatory complications: Secondary | ICD-10-CM

## 2017-12-19 DIAGNOSIS — Z8673 Personal history of transient ischemic attack (TIA), and cerebral infarction without residual deficits: Secondary | ICD-10-CM

## 2017-12-19 DIAGNOSIS — J690 Pneumonitis due to inhalation of food and vomit: Secondary | ICD-10-CM

## 2017-12-19 LAB — BASIC METABOLIC PANEL
Anion gap: 14 (ref 5–15)
BUN: 11 mg/dL (ref 8–23)
CO2: 20 mmol/L — ABNORMAL LOW (ref 22–32)
Calcium: 9.6 mg/dL (ref 8.9–10.3)
Chloride: 100 mmol/L (ref 98–111)
Creatinine, Ser: 0.82 mg/dL (ref 0.44–1.00)
GFR calc Af Amer: >60 mL/min (ref >60)
GFR calc non Af Amer: >60 mL/min (ref >60)
Glucose, Bld: 256 mg/dL — ABNORMAL HIGH (ref 70–99)
Potassium: 3 mmol/L — ABNORMAL LOW (ref 3.5–5.1)
Sodium: 134 mmol/L — ABNORMAL LOW (ref 135–145)

## 2017-12-19 LAB — CBC
HCT: 37 % (ref 36.0–46.0)
Hemoglobin: 12.1 g/dL (ref 12.0–15.0)
MCH: 26.4 pg (ref 26.0–34.0)
MCHC: 32.7 g/dL (ref 30.0–36.0)
MCV: 80.8 fL (ref 80.0–100.0)
Platelets: 225 10*3/uL (ref 150–400)
RBC: 4.58 MIL/uL (ref 3.87–5.11)
RDW: 14.2 % (ref 11.5–15.5)
WBC: 10.8 10*3/uL — ABNORMAL HIGH (ref 4.0–10.5)
nRBC: 0 % (ref 0.0–0.2)

## 2017-12-19 LAB — LIPID PANEL
Cholesterol: 132 mg/dL (ref 0–200)
HDL: 48 mg/dL (ref >40)
LDL Cholesterol: 74 mg/dL (ref 0–99)
Total CHOL/HDL Ratio: 2.8 RATIO
Triglycerides: 51 mg/dL (ref <150)
VLDL: 10 mg/dL (ref 0–40)

## 2017-12-19 LAB — HEMOGLOBIN A1C
Hgb A1c MFr Bld: 7.1 % — ABNORMAL HIGH (ref 4.8–5.6)
MEAN PLASMA GLUCOSE: 157.07 mg/dL

## 2017-12-19 LAB — GLUCOSE, CAPILLARY: Glucose-Capillary: 250 mg/dL — ABNORMAL HIGH (ref 70–99)

## 2017-12-19 MED ORDER — ACETAMINOPHEN 325 MG PO TABS: 650.0000 mg | ORAL_TABLET | Freq: Four times a day (QID) | ORAL | Status: DC | PRN

## 2017-12-19 MED ORDER — ORAL CARE MOUTH RINSE
15.0000 mL | Freq: Two times a day (BID) | OROMUCOSAL | Status: DC
  Administered 2017-12-21: 15 mL via OROMUCOSAL

## 2017-12-19 MED ORDER — GLYCOPYRROLATE 0.2 MG/ML IJ SOLN: 0.2000 mg | INTRAMUSCULAR | Status: DC | PRN

## 2017-12-19 MED ORDER — MORPHINE 100MG IN NS 100ML (1MG/ML) PREMIX INFUSION
1.0000 mg/h | INTRAVENOUS | Status: DC
  Administered 2017-12-19: 1 mg/h via INTRAVENOUS
  Administered 2017-12-21: 2 mg/h via INTRAVENOUS
  Filled 2017-12-19 (×2): qty 100

## 2017-12-19 MED ORDER — ONDANSETRON 4 MG PO TBDP: 4.0000 mg | ORAL_TABLET | Freq: Four times a day (QID) | ORAL | Status: DC | PRN

## 2017-12-19 MED ORDER — POLYVINYL ALCOHOL 1.4 % OP SOLN
1.0000 [drp] | Freq: Four times a day (QID) | OPHTHALMIC | Status: DC | PRN
  Filled 2017-12-19: qty 15

## 2017-12-19 MED ORDER — ONDANSETRON HCL 4 MG/2ML IJ SOLN: 4.0000 mg | Freq: Four times a day (QID) | INTRAMUSCULAR | Status: DC | PRN

## 2017-12-19 MED ORDER — HALOPERIDOL LACTATE 5 MG/ML IJ SOLN: 0.5000 mg | INTRAMUSCULAR | Status: DC | PRN

## 2017-12-19 MED ORDER — HALOPERIDOL LACTATE 2 MG/ML PO CONC
0.5000 mg | ORAL | Status: DC | PRN
  Filled 2017-12-19: qty 0.3

## 2017-12-19 MED ORDER — BIOTENE DRY MOUTH MT LIQD: 15.0000 mL | OROMUCOSAL | Status: DC | PRN

## 2017-12-19 MED ORDER — POTASSIUM CHLORIDE 10 MEQ/100ML IV SOLN
10.0000 meq | INTRAVENOUS | Status: DC
  Administered 2017-12-19: 10 meq via INTRAVENOUS
  Filled 2017-12-19 (×4): qty 100

## 2017-12-19 MED ORDER — SCOPOLAMINE 1 MG/3DAYS TD PT72
1.0000 | MEDICATED_PATCH | TRANSDERMAL | Status: DC
  Administered 2017-12-19: 1.5 mg via TRANSDERMAL
  Filled 2017-12-19: qty 1

## 2017-12-19 MED ORDER — HALOPERIDOL 0.5 MG PO TABS
0.5000 mg | ORAL_TABLET | ORAL | Status: DC | PRN
  Filled 2017-12-19: qty 1

## 2017-12-19 MED ORDER — ACETAMINOPHEN 650 MG RE SUPP: 650.0000 mg | Freq: Four times a day (QID) | RECTAL | Status: DC | PRN

## 2017-12-19 MED ORDER — GLYCOPYRROLATE 1 MG PO TABS
1.0000 mg | ORAL_TABLET | ORAL | Status: DC | PRN
  Filled 2017-12-19: qty 1

## 2017-12-19 NOTE — Progress Notes (Signed)
Pt has worsening neurological assessment and new extension in response to noxious stimuli, MD notified. No new orders received, will continue to monitor.

## 2017-12-19 NOTE — Progress Notes (Signed)
OT Cancellation Note  Patient Details Name: Debbie Ortiz: [id number] DOB: 09/01/1923   Cancelled Treatment:    Reason Eval/Treat Not Completed: Patient not medically ready. Per PT, palliative medicine is getting involved and pt is not medically appropriate.  Acute OT is signing off.  Please re-consult if needed in future.    Chancy Milroyhristie S Edla Para, OT Acute Rehabilitation Services Pager [phone] Office [phone]   Chancy MilroyChristie S Grantley Savage 12/19/2017, 9:24 AM

## 2017-12-19 NOTE — Progress Notes (Signed)
STROKE TEAM PROGRESS NOTE   SUBJECTIVE (INTERVAL HISTORY) Her nephew and niece in law are at the bedside. Pt was able to briefly open eyes on voice but struggling with breathing and secretion, tachypnea and SOB. Not able to following any commands. No spontaneous movement of all extremities. I had long discussion with niece in law who is POA and pt nephew at bedside, updated pt current condition, treatment plan and extremely poor prognosis. They expressed understanding and requested for comfort care measures which I felt is very appropriate at this time.     OBJECTIVE Temp:  [97 F (36.1 C)-98.9 F (37.2 C)] 98.5 F (36.9 C) (12/02 0400) Pulse Rate:  [62-91] 69 (12/02 0800) Cardiac Rhythm: Normal sinus rhythm (12/02 0400) Resp:  [11-31] 30 (12/02 0800) BP: (99-189)/(49-117) 99/54 (12/02 0800) SpO2:  [90 %-100 %] 92 % (12/02 0800) Weight:  [67.4 kg-68.5 kg] 67.4 kg (12/01 1600)  Recent Labs  Lab 01-07-18 1425 2018-01-07 1800 January 07, 2018 2046 12/19/17 0917  GLUCAP 187* 216* 204* 250*   Recent Labs  Lab 01-07-2018 1416 January 07, 2018 1418 12/19/17 0401  NA 136 136 134*  K 3.9 3.9 3.0*  CL 104 109 100  CO2 19*  --  20*  GLUCOSE 208* 208* 256*  BUN 14 15 11   CREATININE 0.81 0.60 0.82  CALCIUM 9.7  --  9.6   Recent Labs  Lab January 07, 2018 1416  AST 38  ALT 24  ALKPHOS 111  BILITOT 0.6  PROT 7.5  ALBUMIN 3.6   Recent Labs  Lab 01-07-18 1416 2018/01/07 1418 12/19/17 0401  WBC 6.7  --  10.8*  NEUTROABS 4.4  --   --   HGB 12.7 13.3 12.1  HCT 40.6 39.0 37.0  MCV 83.5  --  80.8  PLT 240  --  225   No results for input(s): CKTOTAL, CKMB, CKMBINDEX, TROPONINI in the last 168 hours. Recent Labs    01-07-2018 1416  LABPROT 13.6  INR 1.05   No results for input(s): COLORURINE, LABSPEC, PHURINE, GLUCOSEU, HGBUR, BILIRUBINUR, KETONESUR, PROTEINUR, UROBILINOGEN, NITRITE, LEUKOCYTESUR in the last 72 hours.  Invalid input(s): APPERANCEUR     Component Value Date/Time   CHOL 132  12/19/2017 0401   TRIG 51 12/19/2017 0401   HDL 48 12/19/2017 0401   CHOLHDL 2.8 12/19/2017 0401   VLDL 10 12/19/2017 0401   LDLCALC 74 12/19/2017 0401   Lab Results  Component Value Date   HGBA1C 7.1 (H) 12/19/2017      Component Value Date/Time   LABOPIA NONE DETECTED 05/15/2017 1011   COCAINSCRNUR NONE DETECTED 05/15/2017 1011   LABBENZ POSITIVE (A) 05/15/2017 1011   AMPHETMU NONE DETECTED 05/15/2017 1011   THCU NONE DETECTED 05/15/2017 1011   LABBARB NONE DETECTED 05/15/2017 1011    Recent Labs  Lab 07-Jan-2018 1416  ETH <10    I have personally reviewed the radiological images below and agree with the radiology interpretations.  Ct Angio Head W Or Wo Contrast  Result Date: 07-Jan-2018 CLINICAL DATA:  Stroke.  Right-sided gaze.  Nonverbal EXAM: CT ANGIOGRAPHY HEAD AND NECK CT PERFUSION BRAIN TECHNIQUE: Multidetector CT imaging of the head and neck was performed using the standard protocol during bolus administration of intravenous contrast. Multiplanar CT image reconstructions and MIPs were obtained to evaluate the vascular anatomy. Carotid stenosis measurements (when applicable) are obtained utilizing NASCET criteria, using the distal internal carotid diameter as the denominator. Multiphase CT imaging of the brain was performed following IV bolus contrast injection. Subsequent parametric perfusion maps  were calculated using RAPID software. CONTRAST:  90mL ISOVUE-370 IOPAMIDOL (ISOVUE-370) INJECTION 76% COMPARISON:  CT head 2017/12/31. FINDINGS: CTA NECK FINDINGS Aortic arch: Atherosclerotic disease aortic arch. Proximal great vessels widely patent without significant stenosis. Right carotid system: Atherosclerotic plaque right carotid bulb. No significant carotid stenosis. Left carotid system: Atherosclerotic calcification left carotid bulb without significant stenosis. Vertebral arteries: Both vertebral arteries patent to the skull base without significant stenosis. Severe stenosis of  the distal right vertebral artery at the level the dura. Skeleton: Severe spondylosis at C3-4 with spinal stenosis. Moderate spurring at C4-5 and C5-6. No acute skeletal abnormality. Other neck: Negative Upper chest: Negative Review of the MIP images confirms the above findings CTA HEAD FINDINGS Anterior circulation: Atherosclerotic calcification and mild stenosis of the cavernous carotid bilaterally. Both anterior cerebral arteries patent. Left middle cerebral artery patent without significant stenosis. High-grade stenosis distal right M1 segment with trickle flow in the right MCA branches. This corresponds to the area of hyperdense MCA on prior CT Posterior circulation: Severe calcific stenosis right V4 segment. Mild stenosis V3 segment bilaterally. Both vertebral arteries patent to the basilar. Mild atherosclerotic disease in the basilar. Severe stenosis left posterior cerebral artery. Right posterior cerebral artery widely patent. Venous sinuses: Not visualized due to arterial phase scanning Anatomic variants: None Delayed phase: Not performed Review of the MIP images confirms the above findings CT Brain Perfusion Findings: CBF (<30%) Volume: 50mL Perfusion (Tmax>6.0s) volume: Mismatch Volume: Infarction Location:Right MCA territory involving the right frontal parietal lobe and right lateral temporal lobe. Sparing of the basal ganglia. Decreased perfusion in the left MCA infarct which is most likely chronic based on CT. IMPRESSION: 1. Acute right MCA infarct. CBF 50 mL and delayed perfusion volume 177 mL. 2. Near complete occlusion distal right M1 segment with trickle flow in right MCA branches. 3. Severe stenosis right V4 segment.  Severe stenosis left PCA 4. Chronic left  MCA infarct 5. These results were called by telephone at the time of interpretation on 2017-12-31 at 3:03 pm to Dr. Ritta Slot , who verbally acknowledged these results. Electronically Signed   By: Marlan Palau M.D.    On: 12-31-2017 15:05   Ct Head Wo Contrast  Result Date: 12-31-17 CLINICAL DATA:  Follow-up examination for acute stroke, decreased level of consciousness status post tPA. EXAM: CT HEAD WITHOUT CONTRAST TECHNIQUE: Contiguous axial images were obtained from the base of the skull through the vertex without intravenous contrast. COMPARISON:  Prior CTs from earlier the same day. FINDINGS: Brain: Large volume evolving hypodensity involving the right cerebral hemisphere is more conspicuous as compared to previous exam, compatible with evolving large right MCA territory infarct. Area of involvement closely matches previously seen ischemic penumbra on prior CT perfusion, with involvement of the majority of the right MCA territory. Developing regional mass effect and edema with new partial effacement of the right lateral ventricle. 3 mm of right-to-left shift is new. Ventricular size otherwise stable without hydrocephalus or overt ventricular trapping at this time. Basilar cisterns remain patent. Subtle scattered hyperdensity within the area of infarction suspected to reflect mild petechial hemorrhage. No frank hemorrhagic transformation or overt intracranial hemorrhage status post tPA. Late subacute to chronic left MCA territory infarcts again noted, stable. No mass lesion. No extra-axial fluid collection. Vascular: Abnormal hyperdensity throughout the distal right M1 and right MCA branches, consistent with thrombus. Calcified atherosclerosis at the skull base. Skull: Scalp soft tissues and calvarium within normal limits. Sinuses/Orbits: Globes and orbital soft tissues demonstrate  no acute finding. Right gaze deviation noted. Mild scattered mucosal thickening within the ethmoidal air cells. Torus palatini noted. Mastoid air cells are clear. Other: None. IMPRESSION: 1. Evolving large volume acute right MCA territory infarct with early edema and regional mass effect with new 3 mm of right-to-left shift. 2. Subtle  hyperdensity within the area of infarction, likely reflecting mild petechial hemorrhage. No frank hemorrhagic transformation status post tPA. 3. Remote left MCA territory infarcts, stable. Electronically Signed   By: Rise MuBenjamin  McClintock M.D.   On: 01/05/2018 23:57   Ct Angio Neck W Or Wo Contrast  Result Date: 01/01/2018 CLINICAL DATA:  Stroke.  Right-sided gaze.  Nonverbal EXAM: CT ANGIOGRAPHY HEAD AND NECK CT PERFUSION BRAIN TECHNIQUE: Multidetector CT imaging of the head and neck was performed using the standard protocol during bolus administration of intravenous contrast. Multiplanar CT image reconstructions and MIPs were obtained to evaluate the vascular anatomy. Carotid stenosis measurements (when applicable) are obtained utilizing NASCET criteria, using the distal internal carotid diameter as the denominator. Multiphase CT imaging of the brain was performed following IV bolus contrast injection. Subsequent parametric perfusion maps were calculated using RAPID software. CONTRAST:  90mL ISOVUE-370 IOPAMIDOL (ISOVUE-370) INJECTION 76% COMPARISON:  CT head 12/23/2017. FINDINGS: CTA NECK FINDINGS Aortic arch: Atherosclerotic disease aortic arch. Proximal great vessels widely patent without significant stenosis. Right carotid system: Atherosclerotic plaque right carotid bulb. No significant carotid stenosis. Left carotid system: Atherosclerotic calcification left carotid bulb without significant stenosis. Vertebral arteries: Both vertebral arteries patent to the skull base without significant stenosis. Severe stenosis of the distal right vertebral artery at the level the dura. Skeleton: Severe spondylosis at C3-4 with spinal stenosis. Moderate spurring at C4-5 and C5-6. No acute skeletal abnormality. Other neck: Negative Upper chest: Negative Review of the MIP images confirms the above findings CTA HEAD FINDINGS Anterior circulation: Atherosclerotic calcification and mild stenosis of the cavernous carotid  bilaterally. Both anterior cerebral arteries patent. Left middle cerebral artery patent without significant stenosis. High-grade stenosis distal right M1 segment with trickle flow in the right MCA branches. This corresponds to the area of hyperdense MCA on prior CT Posterior circulation: Severe calcific stenosis right V4 segment. Mild stenosis V3 segment bilaterally. Both vertebral arteries patent to the basilar. Mild atherosclerotic disease in the basilar. Severe stenosis left posterior cerebral artery. Right posterior cerebral artery widely patent. Venous sinuses: Not visualized due to arterial phase scanning Anatomic variants: None Delayed phase: Not performed Review of the MIP images confirms the above findings CT Brain Perfusion Findings: CBF (<30%) Volume: 50mL Perfusion (Tmax>6.0s) volume: 177mL Mismatch Volume: 127mL Infarction Location:Right MCA territory involving the right frontal parietal lobe and right lateral temporal lobe. Sparing of the basal ganglia. Decreased perfusion in the left MCA infarct which is most likely chronic based on CT. IMPRESSION: 1. Acute right MCA infarct. CBF 50 mL and delayed perfusion volume 177 mL. 2. Near complete occlusion distal right M1 segment with trickle flow in right MCA branches. 3. Severe stenosis right V4 segment.  Severe stenosis left PCA 4. Chronic left  MCA infarct 5. These results were called by telephone at the time of interpretation on 01/04/2018 at 3:03 pm to Dr. Ritta SlotMCNEILL KIRKPATRICK , who verbally acknowledged these results. Electronically Signed   By: Marlan Palauharles  Clark M.D.   On: 12/22/2017 15:05   Ct Cerebral Perfusion W Contrast  Result Date: 12/19/2017 CLINICAL DATA:  Stroke.  Right-sided gaze.  Nonverbal EXAM: CT ANGIOGRAPHY HEAD AND NECK CT PERFUSION BRAIN TECHNIQUE: Multidetector CT imaging of  the head and neck was performed using the standard protocol during bolus administration of intravenous contrast. Multiplanar CT image reconstructions and MIPs  were obtained to evaluate the vascular anatomy. Carotid stenosis measurements (when applicable) are obtained utilizing NASCET criteria, using the distal internal carotid diameter as the denominator. Multiphase CT imaging of the brain was performed following IV bolus contrast injection. Subsequent parametric perfusion maps were calculated using RAPID software. CONTRAST:  90mL ISOVUE-370 IOPAMIDOL (ISOVUE-370) INJECTION 76% COMPARISON:  CT head 12/26/2017. FINDINGS: CTA NECK FINDINGS Aortic arch: Atherosclerotic disease aortic arch. Proximal great vessels widely patent without significant stenosis. Right carotid system: Atherosclerotic plaque right carotid bulb. No significant carotid stenosis. Left carotid system: Atherosclerotic calcification left carotid bulb without significant stenosis. Vertebral arteries: Both vertebral arteries patent to the skull base without significant stenosis. Severe stenosis of the distal right vertebral artery at the level the dura. Skeleton: Severe spondylosis at C3-4 with spinal stenosis. Moderate spurring at C4-5 and C5-6. No acute skeletal abnormality. Other neck: Negative Upper chest: Negative Review of the MIP images confirms the above findings CTA HEAD FINDINGS Anterior circulation: Atherosclerotic calcification and mild stenosis of the cavernous carotid bilaterally. Both anterior cerebral arteries patent. Left middle cerebral artery patent without significant stenosis. High-grade stenosis distal right M1 segment with trickle flow in the right MCA branches. This corresponds to the area of hyperdense MCA on prior CT Posterior circulation: Severe calcific stenosis right V4 segment. Mild stenosis V3 segment bilaterally. Both vertebral arteries patent to the basilar. Mild atherosclerotic disease in the basilar. Severe stenosis left posterior cerebral artery. Right posterior cerebral artery widely patent. Venous sinuses: Not visualized due to arterial phase scanning Anatomic variants:  None Delayed phase: Not performed Review of the MIP images confirms the above findings CT Brain Perfusion Findings: CBF (<30%) Volume: 50mL Perfusion (Tmax>6.0s) volume: Mismatch Volume: Infarction Location:Right MCA territory involving the right frontal parietal lobe and right lateral temporal lobe. Sparing of the basal ganglia. Decreased perfusion in the left MCA infarct which is most likely chronic based on CT. IMPRESSION: 1. Acute right MCA infarct. CBF 50 mL and delayed perfusion volume 177 mL. 2. Near complete occlusion distal right M1 segment with trickle flow in right MCA branches. 3. Severe stenosis right V4 segment.  Severe stenosis left PCA 4. Chronic left  MCA infarct 5. These results were called by telephone at the time of interpretation on 01/04/2018 at 3:03 pm to Dr. Ritta Slot , who verbally acknowledged these results. Electronically Signed   By: Marlan Palau M.D.   On: 12/30/2017 15:05   Dg Chest Port 1 View  Result Date: 12/22/2017 CLINICAL DATA:  Pneumonia, possible aspiration EXAM: PORTABLE CHEST 1 VIEW COMPARISON:  07/15/2017 FINDINGS: Heart and mediastinal contours are within normal limits. No focal opacities or effusions. No acute bony abnormality. Aortic arch calcifications. IMPRESSION: No active disease. Electronically Signed   By: Charlett Nose M.D.   On: 01/15/2018 22:00   Ct Head Code Stroke Wo Contrast  Result Date: 01/03/2018 CLINICAL DATA:  Code stroke.  Right-sided gaze EXAM: CT HEAD WITHOUT CONTRAST TECHNIQUE: Contiguous axial images were obtained from the base of the skull through the vertex without intravenous contrast. COMPARISON:  CT head 09/01/2017 FINDINGS: Brain: Chronic infarct left posterior frontal and parietal lobe MCA distribution, with encephalomalacia and hypodensity Subtle hypodensity in the right frontal and parietal cortex which could be acute infarct. No definite edema in the basal ganglia. Negative for hemorrhage or mass.  Mild atrophy  Vascular: Mild hyperdensity  right MCA which could be calcified plaque or possibly thrombus. This was not present previously. Skull: Negative Sinuses/Orbits: Bilateral cataract surgery. Mild mucosal edema paranasal sinuses Other: None ASPECTS (Alberta Stroke Program Early CT Score) - Ganglionic level infarction (caudate, lentiform nuclei, internal capsule, insula, M1-M3 cortex): 6 - Supraganglionic infarction (M4-M6 cortex): 1 Total score (0-10 with 10 being normal): 7 IMPRESSION: 1. Possible acute right MCA infarct. This is not definite and could be due to scan quality. 2. Possible dense right MCA versus calcific plaque but not identified on the prior CT. 3. ASPECTS is 7 4. These results were called by telephone at the time of interpretation on 12/28/2017 at 2:37 pm to Dr. Amada Jupiter , who verbally acknowledged these results. Electronically Signed   By: Marlan Palau M.D.   On: 01/07/2018 14:38    PHYSICAL EXAM  Temp:  [97 F (36.1 C)-98.9 F (37.2 C)] 98.5 F (36.9 C) (12/02 0400) Pulse Rate:  [62-91] 69 (12/02 0800) Resp:  [11-31] 30 (12/02 0800) BP: (99-189)/(49-117) 99/54 (12/02 0800) SpO2:  [90 %-100 %] 92 % (12/02 0800) Weight:  [67.4 kg-68.5 kg] 67.4 kg (12/01 1600)  General - cachectic apparance, well developed, in respiratory distress.  Ophthalmologic - fundi not visualized due to noncooperation.  Cardiovascular - Regular rate and rhythm.  Neuro - pt is in respiratory distress with copious secretions deep in the trachea, significant rhochi on lung ascultation. Eyes half way closed, with voice stimulation, she was able to open eyes briefly, but not follow any commands. Not blinking to visual threat. PERRL, 2mm, right forced gaze, not able to move to the left, right facial droop, but no effort to open mouth or tongue protrusion. On pain stimulation, slight withdraw on the left LE, but no movement on other extremities. Muscle tone increased on all extremities. Right babinski positive.  Sensation, coordination and gait not tested.    ASSESSMENT/PLAN Ms. Debbie Ortiz is a 84 y.o. female with history of CHF, DM, HTN, stroke with right sided deficit wheel-chair bound most time in SNF admitted for AMS with aphasia. TPA given.    Stroke:  right MCA infarct due to right M1 occlusion - embolic pattern, secondary to unknown source  Resultant right gaze, left hemiplegia on top of chronic right hemiplegia  CT head right MCA early acute infarct with old left MCA infarct  CTA head and neck - right M1 cut off, high grade stenosis of left PCA and right V4  CTP large infarct core with large penumbra  LDL 74  HgbA1c 7.1  SCDs for VTE prophylaxis - now off SCDs given comfort care  NPO  aspirin 81 mg daily and clopidogrel 75 mg daily prior to admission, now on No antithrombotic s/p tPA  I had long discussion with niece in law who is POA and pt nephew at bedside, updated pt current condition, treatment plan and extremely poor prognosis. They expressed understanding and requested for comfort care measures which I felt is very appropriate at this time.  Diabetes  HgbA1c 7.1 goal < 7.0  Uncontrolled  No SSI or CBG monitoring given comfort care  Hypertension . Stable . BP goal < 180/105 s/p tPA  Will start comfort care measures  Hyperlipidemia  Home meds:  lipitor 10   LDL 74, goal < 70  Other Stroke Risk Factors  Advanced age  Hx of stroke with right hemiplegia - source unclear  CHF  Other Active Problems  [facility] resident with wheelchair bound most of the time  Hospital day # 1  This patient is critically ill due to large right MCA Infarct, respiratory distress, aspiration, left hemiplegia and at significant risk of neurological worsening, death form respiratory failure, heart failure, recurrent stroke, cerebral edema and brain herniation, seizure. This patient's care requires constant monitoring of vital signs, hemodynamics, respiratory and cardiac  monitoring, review of multiple databases, neurological assessment, discussion with family, other specialists and medical decision making of high complexity.  I had long discussion with niece in law who is POA and pt nephew at bedside, updated pt current condition, treatment plan and extremely poor prognosis. They expressed understanding and requested for comfort care measures which I felt is very appropriate at this time. I spent 40 minutes of neurocritical care time in the care of this patient.   Marvel Plan, MD PhD Stroke Neurology 12/19/2017 9:48 AM    To contact Stroke Continuity provider, please refer to [email or web address]. After hours, contact General Neurology

## 2017-12-19 NOTE — Progress Notes (Signed)
PT Cancellation Note  Patient Details Name: Debbie Ortiz MRN: [id number] DOB: 07/22/1923   Cancelled Treatment:    Reason Eval/Treat Not Completed: Patient not medically ready. Per Dr. Roda ShuttersXu, palliative medicine is getting involved and a family meeting is planned for today. Pt not medically appropriate and medical prognosis is poor. Acute PT signing off. Please re-consult if needed in future.  Lewis ShockAshly Aldene Hendon, PT, DPT Acute Rehabilitation Services Pager #: [phone] Office #: [phone]    Iona Hansenshly M Traylon Schimming 12/19/2017, 9:16 AM

## 2017-12-19 NOTE — Progress Notes (Signed)
SLP Cancellation Note  Patient Details Name: Floydene FlockJulia Mae Steffler MRN: [id number] DOB: 06/26/1923   Cancelled treatment:       Reason Eval/Treat Not Completed: Patient not medically ready for cognitive linguistic assessment at this time.    Beckett Maden, Riley NearingBonnie Caroline 12/19/2017, 8:00 AM

## 2017-12-19 NOTE — Progress Notes (Signed)
Pt is comfort care and neuro checks have been d/c by neurologist.  TPA documentation d/c.  RN will continue to monitor.

## 2017-12-19 NOTE — Progress Notes (Signed)
Pt having episodes of low spo2 with tan/pink frothy secretions in her mouth. Pt orally and NT suctioned through R nare x2 with moderate amount of green thin liquid removed.

## 2017-12-20 DIAGNOSIS — Z515 Encounter for palliative care: Secondary | ICD-10-CM

## 2017-12-20 NOTE — Plan of Care (Signed)
  Problem: Role Relationship: Goal: Family's ability to cope with current situation will improve Outcome: Progressing   Problem: Pain Management: Goal: Satisfaction with pain management regimen will improve Outcome: Progressing   

## 2017-12-20 NOTE — Progress Notes (Signed)
Nutrition Brief Note  Chart reviewed. Pt now transitioning to comfort care.  No further nutrition interventions warranted at this time.  Please re-consult as needed.   Devyn Griffing A. Kiree Dejarnette, RD, LDN, CDE Pager: [phone] After hours Pager: [phone]  

## 2017-12-20 NOTE — Progress Notes (Signed)
STROKE TEAM PROGRESS NOTE   SUBJECTIVE (INTERVAL HISTORY) No family at bedside.  Patient lying in bed, has decreased RR, but no distress.  On morphine drip at 2 mg/h.  Expecting hospital death.    OBJECTIVE Temp:  [98.9 F (37.2 C)-99.9 F (37.7 C)] 98.9 F (37.2 C) (12/03 0609) Pulse Rate:  [69-86] 70 (12/03 0609) Resp:  [25-30] 25 (12/02 1230) BP: (99-103)/(52-67) 99/62 (12/03 0609) SpO2:  [93 %-100 %] 93 % (12/03 0609)  Recent Labs  Lab 01/16/2018 1425 12/22/2017 1800 01/12/2018 2046 12/19/17 0917  GLUCAP 187* 216* 204* 250*   Recent Labs  Lab 01/14/2018 1416 12/20/2017 1418 12/19/17 0401  NA 136 136 134*  K 3.9 3.9 3.0*  CL 104 109 100  CO2 19*  --  20*  GLUCOSE 208* 208* 256*  BUN 14 15 11   CREATININE 0.81 0.60 0.82  CALCIUM 9.7  --  9.6   Recent Labs  Lab 01/12/2018 1416  AST 38  ALT 24  ALKPHOS 111  BILITOT 0.6  PROT 7.5  ALBUMIN 3.6   Recent Labs  Lab 01/08/2018 1416 12/28/2017 1418 12/19/17 0401  WBC 6.7  --  10.8*  NEUTROABS 4.4  --   --   HGB 12.7 13.3 12.1  HCT 40.6 39.0 37.0  MCV 83.5  --  80.8  PLT 240  --  225   No results for input(s): CKTOTAL, CKMB, CKMBINDEX, TROPONINI in the last 168 hours. Recent Labs    01/07/2018 1416  LABPROT 13.6  INR 1.05   No results for input(s): COLORURINE, LABSPEC, PHURINE, GLUCOSEU, HGBUR, BILIRUBINUR, KETONESUR, PROTEINUR, UROBILINOGEN, NITRITE, LEUKOCYTESUR in the last 72 hours.  Invalid input(s): APPERANCEUR     Component Value Date/Time   CHOL 132 12/19/2017 0401   TRIG 51 12/19/2017 0401   HDL 48 12/19/2017 0401   CHOLHDL 2.8 12/19/2017 0401   VLDL 10 12/19/2017 0401   LDLCALC 74 12/19/2017 0401   Lab Results  Component Value Date   HGBA1C 7.1 (H) 12/19/2017      Component Value Date/Time   LABOPIA NONE DETECTED 05/15/2017 1011   COCAINSCRNUR NONE DETECTED 05/15/2017 1011   LABBENZ POSITIVE (A) 05/15/2017 1011   AMPHETMU NONE DETECTED 05/15/2017 1011   THCU NONE DETECTED 05/15/2017 1011    LABBARB NONE DETECTED 05/15/2017 1011    Recent Labs  Lab 12/19/2017 1416  ETH <10    I have personally reviewed the radiological images below and agree with the radiology interpretations.  Ct Angio Head W Or Wo Contrast  Result Date: 12/20/2017 CLINICAL DATA:  Stroke.  Right-sided gaze.  Nonverbal EXAM: CT ANGIOGRAPHY HEAD AND NECK CT PERFUSION BRAIN TECHNIQUE: Multidetector CT imaging of the head and neck was performed using the standard protocol during bolus administration of intravenous contrast. Multiplanar CT image reconstructions and MIPs were obtained to evaluate the vascular anatomy. Carotid stenosis measurements (when applicable) are obtained utilizing NASCET criteria, using the distal internal carotid diameter as the denominator. Multiphase CT imaging of the brain was performed following IV bolus contrast injection. Subsequent parametric perfusion maps were calculated using RAPID software. CONTRAST:  90mL ISOVUE-370 IOPAMIDOL (ISOVUE-370) INJECTION 76% COMPARISON:  CT head 01/13/2018. FINDINGS: CTA NECK FINDINGS Aortic arch: Atherosclerotic disease aortic arch. Proximal great vessels widely patent without significant stenosis. Right carotid system: Atherosclerotic plaque right carotid bulb. No significant carotid stenosis. Left carotid system: Atherosclerotic calcification left carotid bulb without significant stenosis. Vertebral arteries: Both vertebral arteries patent to the skull base without significant stenosis. Severe stenosis of  the distal right vertebral artery at the level the dura. Skeleton: Severe spondylosis at C3-4 with spinal stenosis. Moderate spurring at C4-5 and C5-6. No acute skeletal abnormality. Other neck: Negative Upper chest: Negative Review of the MIP images confirms the above findings CTA HEAD FINDINGS Anterior circulation: Atherosclerotic calcification and mild stenosis of the cavernous carotid bilaterally. Both anterior cerebral arteries patent. Left middle cerebral  artery patent without significant stenosis. High-grade stenosis distal right M1 segment with trickle flow in the right MCA branches. This corresponds to the area of hyperdense MCA on prior CT Posterior circulation: Severe calcific stenosis right V4 segment. Mild stenosis V3 segment bilaterally. Both vertebral arteries patent to the basilar. Mild atherosclerotic disease in the basilar. Severe stenosis left posterior cerebral artery. Right posterior cerebral artery widely patent. Venous sinuses: Not visualized due to arterial phase scanning Anatomic variants: None Delayed phase: Not performed Review of the MIP images confirms the above findings CT Brain Perfusion Findings: CBF (<30%) Volume: 50mL Perfusion (Tmax>6.0s) volume: 177mL Mismatch Volume: 127mL Infarction Location:Right MCA territory involving the right frontal parietal lobe and right lateral temporal lobe. Sparing of the basal ganglia. Decreased perfusion in the left MCA infarct which is most likely chronic based on CT. IMPRESSION: 1. Acute right MCA infarct. CBF 50 mL and delayed perfusion volume 177 mL. 2. Near complete occlusion distal right M1 segment with trickle flow in right MCA branches. 3. Severe stenosis right V4 segment.  Severe stenosis left PCA 4. Chronic left  MCA infarct 5. These results were called by telephone at the time of interpretation on 12/27/2017 at 3:03 pm to Dr. Ritta SlotMCNEILL KIRKPATRICK , who verbally acknowledged these results. Electronically Signed   By: Marlan Palauharles  Clark M.D.   On: Aug 15, 2017 15:05   Ct Head Wo Contrast  Result Date: 01/07/2018 CLINICAL DATA:  Follow-up examination for acute stroke, decreased level of consciousness status post tPA. EXAM: CT HEAD WITHOUT CONTRAST TECHNIQUE: Contiguous axial images were obtained from the base of the skull through the vertex without intravenous contrast. COMPARISON:  Prior CTs from earlier the same day. FINDINGS: Brain: Large volume evolving hypodensity involving the right cerebral  hemisphere is more conspicuous as compared to previous exam, compatible with evolving large right MCA territory infarct. Area of involvement closely matches previously seen ischemic penumbra on prior CT perfusion, with involvement of the majority of the right MCA territory. Developing regional mass effect and edema with new partial effacement of the right lateral ventricle. 3 mm of right-to-left shift is new. Ventricular size otherwise stable without hydrocephalus or overt ventricular trapping at this time. Basilar cisterns remain patent. Subtle scattered hyperdensity within the area of infarction suspected to reflect mild petechial hemorrhage. No frank hemorrhagic transformation or overt intracranial hemorrhage status post tPA. Late subacute to chronic left MCA territory infarcts again noted, stable. No mass lesion. No extra-axial fluid collection. Vascular: Abnormal hyperdensity throughout the distal right M1 and right MCA branches, consistent with thrombus. Calcified atherosclerosis at the skull base. Skull: Scalp soft tissues and calvarium within normal limits. Sinuses/Orbits: Globes and orbital soft tissues demonstrate no acute finding. Right gaze deviation noted. Mild scattered mucosal thickening within the ethmoidal air cells. Torus palatini noted. Mastoid air cells are clear. Other: None. IMPRESSION: 1. Evolving large volume acute right MCA territory infarct with early edema and regional mass effect with new 3 mm of right-to-left shift. 2. Subtle hyperdensity within the area of infarction, likely reflecting mild petechial hemorrhage. No frank hemorrhagic transformation status post tPA. 3. Remote left MCA territory  infarcts, stable. Electronically Signed   By: Rise Mu M.D.   On: 2017-12-28 23:57   Ct Angio Neck W Or Wo Contrast  Result Date: 2017/12/28 CLINICAL DATA:  Stroke.  Right-sided gaze.  Nonverbal EXAM: CT ANGIOGRAPHY HEAD AND NECK CT PERFUSION BRAIN TECHNIQUE: Multidetector CT  imaging of the head and neck was performed using the standard protocol during bolus administration of intravenous contrast. Multiplanar CT image reconstructions and MIPs were obtained to evaluate the vascular anatomy. Carotid stenosis measurements (when applicable) are obtained utilizing NASCET criteria, using the distal internal carotid diameter as the denominator. Multiphase CT imaging of the brain was performed following IV bolus contrast injection. Subsequent parametric perfusion maps were calculated using RAPID software. CONTRAST:  90mL ISOVUE-370 IOPAMIDOL (ISOVUE-370) INJECTION 76% COMPARISON:  CT head Dec 28, 2017. FINDINGS: CTA NECK FINDINGS Aortic arch: Atherosclerotic disease aortic arch. Proximal great vessels widely patent without significant stenosis. Right carotid system: Atherosclerotic plaque right carotid bulb. No significant carotid stenosis. Left carotid system: Atherosclerotic calcification left carotid bulb without significant stenosis. Vertebral arteries: Both vertebral arteries patent to the skull base without significant stenosis. Severe stenosis of the distal right vertebral artery at the level the dura. Skeleton: Severe spondylosis at C3-4 with spinal stenosis. Moderate spurring at C4-5 and C5-6. No acute skeletal abnormality. Other neck: Negative Upper chest: Negative Review of the MIP images confirms the above findings CTA HEAD FINDINGS Anterior circulation: Atherosclerotic calcification and mild stenosis of the cavernous carotid bilaterally. Both anterior cerebral arteries patent. Left middle cerebral artery patent without significant stenosis. High-grade stenosis distal right M1 segment with trickle flow in the right MCA branches. This corresponds to the area of hyperdense MCA on prior CT Posterior circulation: Severe calcific stenosis right V4 segment. Mild stenosis V3 segment bilaterally. Both vertebral arteries patent to the basilar. Mild atherosclerotic disease in the basilar. Severe  stenosis left posterior cerebral artery. Right posterior cerebral artery widely patent. Venous sinuses: Not visualized due to arterial phase scanning Anatomic variants: None Delayed phase: Not performed Review of the MIP images confirms the above findings CT Brain Perfusion Findings: CBF (<30%) Volume: 50mL Perfusion (Tmax>6.0s) volume: Mismatch Volume: Infarction Location:Right MCA territory involving the right frontal parietal lobe and right lateral temporal lobe. Sparing of the basal ganglia. Decreased perfusion in the left MCA infarct which is most likely chronic based on CT. IMPRESSION: 1. Acute right MCA infarct. CBF 50 mL and delayed perfusion volume 177 mL. 2. Near complete occlusion distal right M1 segment with trickle flow in right MCA branches. 3. Severe stenosis right V4 segment.  Severe stenosis left PCA 4. Chronic left  MCA infarct 5. These results were called by telephone at the time of interpretation on 2017-12-28 at 3:03 pm to Dr. Ritta Slot , who verbally acknowledged these results. Electronically Signed   By: Marlan Palau M.D.   On: 12/28/17 15:05   Ct Cerebral Perfusion W Contrast  Result Date: 28-Dec-2017 CLINICAL DATA:  Stroke.  Right-sided gaze.  Nonverbal EXAM: CT ANGIOGRAPHY HEAD AND NECK CT PERFUSION BRAIN TECHNIQUE: Multidetector CT imaging of the head and neck was performed using the standard protocol during bolus administration of intravenous contrast. Multiplanar CT image reconstructions and MIPs were obtained to evaluate the vascular anatomy. Carotid stenosis measurements (when applicable) are obtained utilizing NASCET criteria, using the distal internal carotid diameter as the denominator. Multiphase CT imaging of the brain was performed following IV bolus contrast injection. Subsequent parametric perfusion maps were calculated using RAPID software. CONTRAST:  90mL ISOVUE-370 IOPAMIDOL (  ISOVUE-370) INJECTION 76% COMPARISON:  CT head 2018-01-16. FINDINGS: CTA  NECK FINDINGS Aortic arch: Atherosclerotic disease aortic arch. Proximal great vessels widely patent without significant stenosis. Right carotid system: Atherosclerotic plaque right carotid bulb. No significant carotid stenosis. Left carotid system: Atherosclerotic calcification left carotid bulb without significant stenosis. Vertebral arteries: Both vertebral arteries patent to the skull base without significant stenosis. Severe stenosis of the distal right vertebral artery at the level the dura. Skeleton: Severe spondylosis at C3-4 with spinal stenosis. Moderate spurring at C4-5 and C5-6. No acute skeletal abnormality. Other neck: Negative Upper chest: Negative Review of the MIP images confirms the above findings CTA HEAD FINDINGS Anterior circulation: Atherosclerotic calcification and mild stenosis of the cavernous carotid bilaterally. Both anterior cerebral arteries patent. Left middle cerebral artery patent without significant stenosis. High-grade stenosis distal right M1 segment with trickle flow in the right MCA branches. This corresponds to the area of hyperdense MCA on prior CT Posterior circulation: Severe calcific stenosis right V4 segment. Mild stenosis V3 segment bilaterally. Both vertebral arteries patent to the basilar. Mild atherosclerotic disease in the basilar. Severe stenosis left posterior cerebral artery. Right posterior cerebral artery widely patent. Venous sinuses: Not visualized due to arterial phase scanning Anatomic variants: None Delayed phase: Not performed Review of the MIP images confirms the above findings CT Brain Perfusion Findings: CBF (<30%) Volume: 50mL Perfusion (Tmax>6.0s) volume: Mismatch Volume: Infarction Location:Right MCA territory involving the right frontal parietal lobe and right lateral temporal lobe. Sparing of the basal ganglia. Decreased perfusion in the left MCA infarct which is most likely chronic based on CT. IMPRESSION: 1. Acute right MCA infarct. CBF  50 mL and delayed perfusion volume 177 mL. 2. Near complete occlusion distal right M1 segment with trickle flow in right MCA branches. 3. Severe stenosis right V4 segment.  Severe stenosis left PCA 4. Chronic left  MCA infarct 5. These results were called by telephone at the time of interpretation on Jan 16, 2018 at 3:03 pm to Dr. Ritta Slot , who verbally acknowledged these results. Electronically Signed   By: Marlan Palau M.D.   On: Jan 16, 2018 15:05   Dg Chest Port 1 View  Result Date: 01/16/2018 CLINICAL DATA:  Pneumonia, possible aspiration EXAM: PORTABLE CHEST 1 VIEW COMPARISON:  07/15/2017 FINDINGS: Heart and mediastinal contours are within normal limits. No focal opacities or effusions. No acute bony abnormality. Aortic arch calcifications. IMPRESSION: No active disease. Electronically Signed   By: Charlett Nose M.D.   On: 16-Jan-2018 22:00   Ct Head Code Stroke Wo Contrast  Result Date: January 16, 2018 CLINICAL DATA:  Code stroke.  Right-sided gaze EXAM: CT HEAD WITHOUT CONTRAST TECHNIQUE: Contiguous axial images were obtained from the base of the skull through the vertex without intravenous contrast. COMPARISON:  CT head 09/01/2017 FINDINGS: Brain: Chronic infarct left posterior frontal and parietal lobe MCA distribution, with encephalomalacia and hypodensity Subtle hypodensity in the right frontal and parietal cortex which could be acute infarct. No definite edema in the basal ganglia. Negative for hemorrhage or mass.  Mild atrophy Vascular: Mild hyperdensity right MCA which could be calcified plaque or possibly thrombus. This was not present previously. Skull: Negative Sinuses/Orbits: Bilateral cataract surgery. Mild mucosal edema paranasal sinuses Other: None ASPECTS ([place] Stroke Program Early CT Score) - Ganglionic level infarction (caudate, lentiform nuclei, internal capsule, insula, M1-M3 cortex): 6 - Supraganglionic infarction (M4-M6 cortex): 1 Total score (0-10 with 10 being normal): 7  IMPRESSION: 1. Possible acute right MCA infarct. This is not definite and could  be due to scan quality. 2. Possible dense right MCA versus calcific plaque but not identified on the prior CT. 3. ASPECTS is 7 4. These results were called by telephone at the time of interpretation on 01/01/2018 at 2:37 pm to Dr. Amada Jupiter , who verbally acknowledged these results. Electronically Signed   By: Marlan Palau M.D.   On: 12/23/2017 14:38    PHYSICAL EXAM  Temp:  [98.9 F (37.2 C)-99.9 F (37.7 C)] 98.9 F (37.2 C) (12/03 0609) Pulse Rate:  [69-86] 70 (12/03 0609) Resp:  [25-30] 25 (12/02 1230) BP: (99-103)/(52-67) 99/62 (12/03 0609) SpO2:  [93 %-100 %] 93 % (12/03 0609)  General - cachectic apparance, well developed, in no respiratory distress.  Ophthalmologic - fundi not visualized due to noncooperation.  Cardiovascular - Regular rate and rhythm.  Neuro -limited exam due to comfort care measures.  Pt is lying in bed without respiratory distress. Eyes closed, not response to voice stimulation, not follow any commands. Right facial droop, no spontaneous movement in all extremities. Muscle tone increased on all extremities. Sensation, coordination and gait not tested.    ASSESSMENT/PLAN Ms. Debbie Ortiz is a 84 y.o. female with history of CHF, DM, HTN, stroke with right sided deficit wheel-chair bound most time in SNF admitted for AMS with aphasia. TPA given.    Stroke:  right MCA infarct due to right M1 occlusion - embolic pattern, secondary to unknown source  Resultant right gaze, left hemiplegia on top of chronic right hemiplegia  CT head right MCA early acute infarct with old left MCA infarct  CTA head and neck - right M1 cut off, high grade stenosis of left PCA and right V4  CTP large infarct core with large penumbra  LDL 74  HgbA1c 7.1  SCDs for VTE prophylaxis - now off SCDs given comfort care  NPO  aspirin 81 mg daily and clopidogrel 75 mg daily prior to  admission  Now in comfort care measures  Diabetes  HgbA1c 7.1 goal < 7.0  Uncontrolled  Hypertension . Stable  Continue comfort care measures  Hyperlipidemia  Home meds:  lipitor 10   LDL 74, goal < 70  Other Stroke Risk Factors  Advanced age  Hx of stroke with right hemiplegia - source unclear  CHF  Other Active Problems  [facility] resident with wheelchair bound most of the time  Hospital day # 2  Marvel Plan, MD PhD Stroke Neurology 12/20/2017 11:02 AM    To contact Stroke Continuity provider, please refer to [email or web address]. After hours, contact General Neurology

## 2018-01-18 NOTE — Progress Notes (Signed)
Went in the room and noted patient not breathing and no pulse, patient is a DNR and comfort measures only. 2  RNs confirmed and pronounced death at 0210. Attending doctor and niece Duane Boston( Aundria Geynor ) notified with funeral home information received. Referral made to [facility] # [id number]. Complete  Post mortem care done and patient placement made aware.

## 2018-01-18 NOTE — Death Summary Note (Signed)
Stroke Discharge Summary  Patient ID: Debbie Ortiz   MRN: [id number]      DOB: 10-05-1923  Date of Admission: 12/25/2017 Date of Discharge: 02-Jan-2018  Attending Physician:  Stroke MD Consultant(s):     none Patient's PCP:  [facility]  DISCHARGE DIAGNOSIS:  Principal Problem:   Cryptogenic stroke (HCC), R MCA s/p tPA Active Problems:   TIA (transient ischemic attack)   Hypertension   History of CHF (congestive heart failure)   Diabetes mellitus without complication (HCC)   Palliative care by specialist   DNR (do not resuscitate)   Past Medical History:  Diagnosis Date  . Arthritis   . CHF (congestive heart failure) (HCC)   . Diabetes mellitus without complication (HCC)   . Hypertension   . Stroke (HCC) 08/2017   R sided deficits  . Thyroid disease   . TIA (transient ischemic attack)    History reviewed. No pertinent surgical history.   LABORATORY STUDIES CBC    Component Value Date/Time   WBC 10.8 (H) 12/19/2017 0401   RBC 4.58 12/19/2017 0401   HGB 12.1 12/19/2017 0401   HCT 37.0 12/19/2017 0401   PLT 225 12/19/2017 0401   MCV 80.8 12/19/2017 0401   MCH 26.4 12/19/2017 0401   MCHC 32.7 12/19/2017 0401   RDW 14.2 12/19/2017 0401   LYMPHSABS 1.6 01/15/2018 1416   MONOABS 0.5 12/20/2017 1416   EOSABS 0.0 12/27/2017 1416   BASOSABS 0.0 12/22/2017 1416   CMP    Component Value Date/Time   NA 134 (L) 12/19/2017 0401   K 3.0 (L) 12/19/2017 0401   CL 100 12/19/2017 0401   CO2 20 (L) 12/19/2017 0401   GLUCOSE 256 (H) 12/19/2017 0401   BUN 11 12/19/2017 0401   CREATININE 0.82 12/19/2017 0401   CALCIUM 9.6 12/19/2017 0401   PROT 7.5 12/29/2017 1416   ALBUMIN 3.6 12/19/2017 1416   AST 38 01/06/2018 1416   ALT 24 12/23/2017 1416   ALKPHOS 111 12/29/2017 1416   BILITOT 0.6 01/14/2018 1416   GFRNONAA >60 12/19/2017 0401   GFRAA >60 12/19/2017 0401   COAGS Lab Results  Component Value Date   INR 1.05 01/06/2018   Lipid Panel     Component Value Date/Time   CHOL 132 12/19/2017 0401   TRIG 51 12/19/2017 0401   HDL 48 12/19/2017 0401   CHOLHDL 2.8 12/19/2017 0401   VLDL 10 12/19/2017 0401   LDLCALC 74 12/19/2017 0401   HgbA1C  Lab Results  Component Value Date   HGBA1C 7.1 (H) 12/19/2017    Alcohol Level    Component Value Date/Time   ETH <10 12/31/2017 1416    SIGNIFICANT DIAGNOSTIC STUDIES Ct Angio Head W Or Wo Contrast  Result Date: 12/22/2017 CLINICAL DATA:  Stroke.  Right-sided gaze.  Nonverbal EXAM: CT ANGIOGRAPHY HEAD AND NECK CT PERFUSION BRAIN TECHNIQUE: Multidetector CT imaging of the head and neck was performed using the standard protocol during bolus administration of intravenous contrast. Multiplanar CT image reconstructions and MIPs were obtained to evaluate the vascular anatomy. Carotid stenosis measurements (when applicable) are obtained utilizing NASCET criteria, using the distal internal carotid diameter as the denominator. Multiphase CT imaging of the brain was performed following IV bolus contrast injection. Subsequent parametric perfusion maps were calculated using RAPID software. CONTRAST:  90mL ISOVUE-370 IOPAMIDOL (ISOVUE-370) INJECTION 76% COMPARISON:  CT head 01/06/2018. FINDINGS: CTA NECK FINDINGS Aortic arch: Atherosclerotic disease aortic arch. Proximal great vessels widely patent without significant stenosis.  Right carotid system: Atherosclerotic plaque right carotid bulb. No significant carotid stenosis. Left carotid system: Atherosclerotic calcification left carotid bulb without significant stenosis. Vertebral arteries: Both vertebral arteries patent to the skull base without significant stenosis. Severe stenosis of the distal right vertebral artery at the level the dura. Skeleton: Severe spondylosis at C3-4 with spinal stenosis. Moderate spurring at C4-5 and C5-6. No acute skeletal abnormality. Other neck: Negative Upper chest: Negative Review of the MIP images confirms the above  findings CTA HEAD FINDINGS Anterior circulation: Atherosclerotic calcification and mild stenosis of the cavernous carotid bilaterally. Both anterior cerebral arteries patent. Left middle cerebral artery patent without significant stenosis. High-grade stenosis distal right M1 segment with trickle flow in the right MCA branches. This corresponds to the area of hyperdense MCA on prior CT Posterior circulation: Severe calcific stenosis right V4 segment. Mild stenosis V3 segment bilaterally. Both vertebral arteries patent to the basilar. Mild atherosclerotic disease in the basilar. Severe stenosis left posterior cerebral artery. Right posterior cerebral artery widely patent. Venous sinuses: Not visualized due to arterial phase scanning Anatomic variants: None Delayed phase: Not performed Review of the MIP images confirms the above findings CT Brain Perfusion Findings: CBF (<30%) Volume: 50mL Perfusion (Tmax>6.0s) volume: Mismatch Volume: Infarction Location:Right MCA territory involving the right frontal parietal lobe and right lateral temporal lobe. Sparing of the basal ganglia. Decreased perfusion in the left MCA infarct which is most likely chronic based on CT. IMPRESSION: 1. Acute right MCA infarct. CBF 50 mL and delayed perfusion volume 177 mL. 2. Near complete occlusion distal right M1 segment with trickle flow in right MCA branches. 3. Severe stenosis right V4 segment.  Severe stenosis left PCA 4. Chronic left  MCA infarct 5. These results were called by telephone at the time of interpretation on 01/13/2018 at 3:03 pm to Dr. Ritta Slot , who verbally acknowledged these results. Electronically Signed   By: Marlan Palau M.D.   On: 01/14/2018 15:05   Ct Head Wo Contrast  Result Date: 12/31/2017 CLINICAL DATA:  Follow-up examination for acute stroke, decreased level of consciousness status post tPA. EXAM: CT HEAD WITHOUT CONTRAST TECHNIQUE: Contiguous axial images were obtained from the base  of the skull through the vertex without intravenous contrast. COMPARISON:  Prior CTs from earlier the same day. FINDINGS: Brain: Large volume evolving hypodensity involving the right cerebral hemisphere is more conspicuous as compared to previous exam, compatible with evolving large right MCA territory infarct. Area of involvement closely matches previously seen ischemic penumbra on prior CT perfusion, with involvement of the majority of the right MCA territory. Developing regional mass effect and edema with new partial effacement of the right lateral ventricle. 3 mm of right-to-left shift is new. Ventricular size otherwise stable without hydrocephalus or overt ventricular trapping at this time. Basilar cisterns remain patent. Subtle scattered hyperdensity within the area of infarction suspected to reflect mild petechial hemorrhage. No frank hemorrhagic transformation or overt intracranial hemorrhage status post tPA. Late subacute to chronic left MCA territory infarcts again noted, stable. No mass lesion. No extra-axial fluid collection. Vascular: Abnormal hyperdensity throughout the distal right M1 and right MCA branches, consistent with thrombus. Calcified atherosclerosis at the skull base. Skull: Scalp soft tissues and calvarium within normal limits. Sinuses/Orbits: Globes and orbital soft tissues demonstrate no acute finding. Right gaze deviation noted. Mild scattered mucosal thickening within the ethmoidal air cells. Torus palatini noted. Mastoid air cells are clear. Other: None. IMPRESSION: 1. Evolving large volume acute right MCA territory infarct  with early edema and regional mass effect with new 3 mm of right-to-left shift. 2. Subtle hyperdensity within the area of infarction, likely reflecting mild petechial hemorrhage. No frank hemorrhagic transformation status post tPA. 3. Remote left MCA territory infarcts, stable. Electronically Signed   By: Rise MuBenjamin  McClintock M.D.   On: 08-Nov-2017 23:57   Ct Angio  Neck W Or Wo Contrast  Result Date: 01/17/2018 CLINICAL DATA:  Stroke.  Right-sided gaze.  Nonverbal EXAM: CT ANGIOGRAPHY HEAD AND NECK CT PERFUSION BRAIN TECHNIQUE: Multidetector CT imaging of the head and neck was performed using the standard protocol during bolus administration of intravenous contrast. Multiplanar CT image reconstructions and MIPs were obtained to evaluate the vascular anatomy. Carotid stenosis measurements (when applicable) are obtained utilizing NASCET criteria, using the distal internal carotid diameter as the denominator. Multiphase CT imaging of the brain was performed following IV bolus contrast injection. Subsequent parametric perfusion maps were calculated using RAPID software. CONTRAST:  90mL ISOVUE-370 IOPAMIDOL (ISOVUE-370) INJECTION 76% COMPARISON:  CT head 08-Nov-2017. FINDINGS: CTA NECK FINDINGS Aortic arch: Atherosclerotic disease aortic arch. Proximal great vessels widely patent without significant stenosis. Right carotid system: Atherosclerotic plaque right carotid bulb. No significant carotid stenosis. Left carotid system: Atherosclerotic calcification left carotid bulb without significant stenosis. Vertebral arteries: Both vertebral arteries patent to the skull base without significant stenosis. Severe stenosis of the distal right vertebral artery at the level the dura. Skeleton: Severe spondylosis at C3-4 with spinal stenosis. Moderate spurring at C4-5 and C5-6. No acute skeletal abnormality. Other neck: Negative Upper chest: Negative Review of the MIP images confirms the above findings CTA HEAD FINDINGS Anterior circulation: Atherosclerotic calcification and mild stenosis of the cavernous carotid bilaterally. Both anterior cerebral arteries patent. Left middle cerebral artery patent without significant stenosis. High-grade stenosis distal right M1 segment with trickle flow in the right MCA branches. This corresponds to the area of hyperdense MCA on prior CT Posterior  circulation: Severe calcific stenosis right V4 segment. Mild stenosis V3 segment bilaterally. Both vertebral arteries patent to the basilar. Mild atherosclerotic disease in the basilar. Severe stenosis left posterior cerebral artery. Right posterior cerebral artery widely patent. Venous sinuses: Not visualized due to arterial phase scanning Anatomic variants: None Delayed phase: Not performed Review of the MIP images confirms the above findings CT Brain Perfusion Findings: CBF (<30%) Volume: 50mL Perfusion (Tmax>6.0s) volume: 177mL Mismatch Volume: 127mL Infarction Location:Right MCA territory involving the right frontal parietal lobe and right lateral temporal lobe. Sparing of the basal ganglia. Decreased perfusion in the left MCA infarct which is most likely chronic based on CT. IMPRESSION: 1. Acute right MCA infarct. CBF 50 mL and delayed perfusion volume 177 mL. 2. Near complete occlusion distal right M1 segment with trickle flow in right MCA branches. 3. Severe stenosis right V4 segment.  Severe stenosis left PCA 4. Chronic left  MCA infarct 5. These results were called by telephone at the time of interpretation on 12/30/2017 at 3:03 pm to Dr. Ritta SlotMCNEILL KIRKPATRICK , who verbally acknowledged these results. Electronically Signed   By: Marlan Palauharles  Clark M.D.   On: 08-Nov-2017 15:05   Ct Cerebral Perfusion W Contrast  Result Date: 01/10/2018 CLINICAL DATA:  Stroke.  Right-sided gaze.  Nonverbal EXAM: CT ANGIOGRAPHY HEAD AND NECK CT PERFUSION BRAIN TECHNIQUE: Multidetector CT imaging of the head and neck was performed using the standard protocol during bolus administration of intravenous contrast. Multiplanar CT image reconstructions and MIPs were obtained to evaluate the vascular anatomy. Carotid stenosis measurements (when applicable) are obtained  utilizing NASCET criteria, using the distal internal carotid diameter as the denominator. Multiphase CT imaging of the brain was performed following IV bolus contrast  injection. Subsequent parametric perfusion maps were calculated using RAPID software. CONTRAST:  90mL ISOVUE-370 IOPAMIDOL (ISOVUE-370) INJECTION 76% COMPARISON:  CT head 01/15/2018. FINDINGS: CTA NECK FINDINGS Aortic arch: Atherosclerotic disease aortic arch. Proximal great vessels widely patent without significant stenosis. Right carotid system: Atherosclerotic plaque right carotid bulb. No significant carotid stenosis. Left carotid system: Atherosclerotic calcification left carotid bulb without significant stenosis. Vertebral arteries: Both vertebral arteries patent to the skull base without significant stenosis. Severe stenosis of the distal right vertebral artery at the level the dura. Skeleton: Severe spondylosis at C3-4 with spinal stenosis. Moderate spurring at C4-5 and C5-6. No acute skeletal abnormality. Other neck: Negative Upper chest: Negative Review of the MIP images confirms the above findings CTA HEAD FINDINGS Anterior circulation: Atherosclerotic calcification and mild stenosis of the cavernous carotid bilaterally. Both anterior cerebral arteries patent. Left middle cerebral artery patent without significant stenosis. High-grade stenosis distal right M1 segment with trickle flow in the right MCA branches. This corresponds to the area of hyperdense MCA on prior CT Posterior circulation: Severe calcific stenosis right V4 segment. Mild stenosis V3 segment bilaterally. Both vertebral arteries patent to the basilar. Mild atherosclerotic disease in the basilar. Severe stenosis left posterior cerebral artery. Right posterior cerebral artery widely patent. Venous sinuses: Not visualized due to arterial phase scanning Anatomic variants: None Delayed phase: Not performed Review of the MIP images confirms the above findings CT Brain Perfusion Findings: CBF (<30%) Volume: 50mL Perfusion (Tmax>6.0s) volume: Mismatch Volume: Infarction Location:Right MCA territory involving the right frontal parietal  lobe and right lateral temporal lobe. Sparing of the basal ganglia. Decreased perfusion in the left MCA infarct which is most likely chronic based on CT. IMPRESSION: 1. Acute right MCA infarct. CBF 50 mL and delayed perfusion volume 177 mL. 2. Near complete occlusion distal right M1 segment with trickle flow in right MCA branches. 3. Severe stenosis right V4 segment.  Severe stenosis left PCA 4. Chronic left  MCA infarct 5. These results were called by telephone at the time of interpretation on 12/30/2017 at 3:03 pm to Dr. Ritta Slot , who verbally acknowledged these results. Electronically Signed   By: Marlan Palau M.D.   On: 12/23/2017 15:05   Dg Chest Port 1 View  Result Date: 01/09/2018 CLINICAL DATA:  Pneumonia, possible aspiration EXAM: PORTABLE CHEST 1 VIEW COMPARISON:  07/15/2017 FINDINGS: Heart and mediastinal contours are within normal limits. No focal opacities or effusions. No acute bony abnormality. Aortic arch calcifications. IMPRESSION: No active disease. Electronically Signed   By: Charlett Nose M.D.   On: 12/28/2017 22:00   Ct Head Code Stroke Wo Contrast  Result Date: 12/22/2017 CLINICAL DATA:  Code stroke.  Right-sided gaze EXAM: CT HEAD WITHOUT CONTRAST TECHNIQUE: Contiguous axial images were obtained from the base of the skull through the vertex without intravenous contrast. COMPARISON:  CT head 09/01/2017 FINDINGS: Brain: Chronic infarct left posterior frontal and parietal lobe MCA distribution, with encephalomalacia and hypodensity Subtle hypodensity in the right frontal and parietal cortex which could be acute infarct. No definite edema in the basal ganglia. Negative for hemorrhage or mass.  Mild atrophy Vascular: Mild hyperdensity right MCA which could be calcified plaque or possibly thrombus. This was not present previously. Skull: Negative Sinuses/Orbits: Bilateral cataract surgery. Mild mucosal edema paranasal sinuses Other: None ASPECTS ([place] Stroke Program Early CT  Score) -  Ganglionic level infarction (caudate, lentiform nuclei, internal capsule, insula, M1-M3 cortex): 6 - Supraganglionic infarction (M4-M6 cortex): 1 Total score (0-10 with 10 being normal): 7 IMPRESSION: 1. Possible acute right MCA infarct. This is not definite and could be due to scan quality. 2. Possible dense right MCA versus calcific plaque but not identified on the prior CT. 3. ASPECTS is 7 4. These results were called by telephone at the time of interpretation on 01/07/2018 at 2:37 pm to Dr. Amada Jupiter , who verbally acknowledged these results. Electronically Signed   By: Marlan Palau M.D.   On: 01/11/2018 14:38       HISTORY OF PRESENT ILLNESS Debbie Ortiz is an 85 y.o. female with PMH CVA (08/2017: at [facility]: Left MCA infarct with residual right side deficit. She did receive TPA and thrombectomy at that time. Right field cut), TIA, CHF, DM, HTN, TIA who presented to [facility] ED from [facility] with c/o AMS. Code stroke was called in CT. Per family they last saw her last Wednesday when she was her baseline. At baseline patient was able to talk, had been working with therapy and was able to assist with transfers. She could also feed herself. The facility said they last saw her well at 12/30/2017 at 1230 and she was talking. They later went to check on her and she was not talking. EMS was called. CT head without hemorrhage. CTA showed right M1 occlusion, chronic Left MCA infarct. Perfusion confirmed CBF 50 ML. Modified Rankin: Rankin Score=4. NIHSS:33. IV tPA was administered. She was admitted to the neuro ICU for post tPA care.    HOSPITAL COURSE Debbie Ortiz is a 85 y.o. female with history of CHF, DM, HTN, stroke with right sided deficit wheel-chair bound most time in SNF admitted for AMS with aphasia. TPA given.    Stroke:  right MCA infarct due to right M1 occlusion - embolic pattern, secondary to unknown source, s/p IV  tPA  Resultant right gaze, left hemiplegia on top of chronic right hemiplegia  CT head right MCA early acute infarct with old left MCA infarct  CTA head and neck - right M1 cut off, high grade stenosis of left PCA and right V4  CTP large infarct core with large penumbra  LDL 74  HgbA1c 7.1  NPO  aspirin 81 mg daily and clopidogrel 75 mg daily prior to admission  No significant response to IV tPA. Given poor neurologic prognosis, POA (niece-in law) after speaking with Dr. Roda Shutters decided to do comfort care measures  She expired in hospital  Diabetes  HgbA1c 7.1, not at goal < 7.0  Hypertension  Stable  Hyperlipidemia  Home meds:  lipitor 10   LDL 74, just over goal < 70  Other Stroke Risk Factors  Advanced age  Hx of stroke with right hemiplegia - source unclear  CHF  Other Active Problems  [facility] resident with wheelchair bound most of the time   DISCHARGE EXAM patient not breathing and no pulse - confirmed by 2 RNs   DISCHARGE PLAN  Disposition:  Transported to morgue  25 minutes were spent preparing discharge.  Annie Main, MSN, APRN, ANVP-BC, AGPCNP-BC Advanced Practice Stroke Nurse [facility] See Amion for Schedule & Pager information 13-Jan-2018 2:37 PM

## 2018-01-18 NOTE — Progress Notes (Signed)
Wasted 96 mg of IV morphine in stericycle bin witnessed by Earmon PhoenixAngelo Brickhouse  RN.

## 2018-01-18 DEATH — deceased

## 2020-02-06 IMAGING — DX DG CHEST 2V
2 series · 2 of 2 positions shown · non-contrast
Comparison: None.

CLINICAL DATA: Status post fall.

EXAM:
CHEST - 2 VIEW

[chest lat]
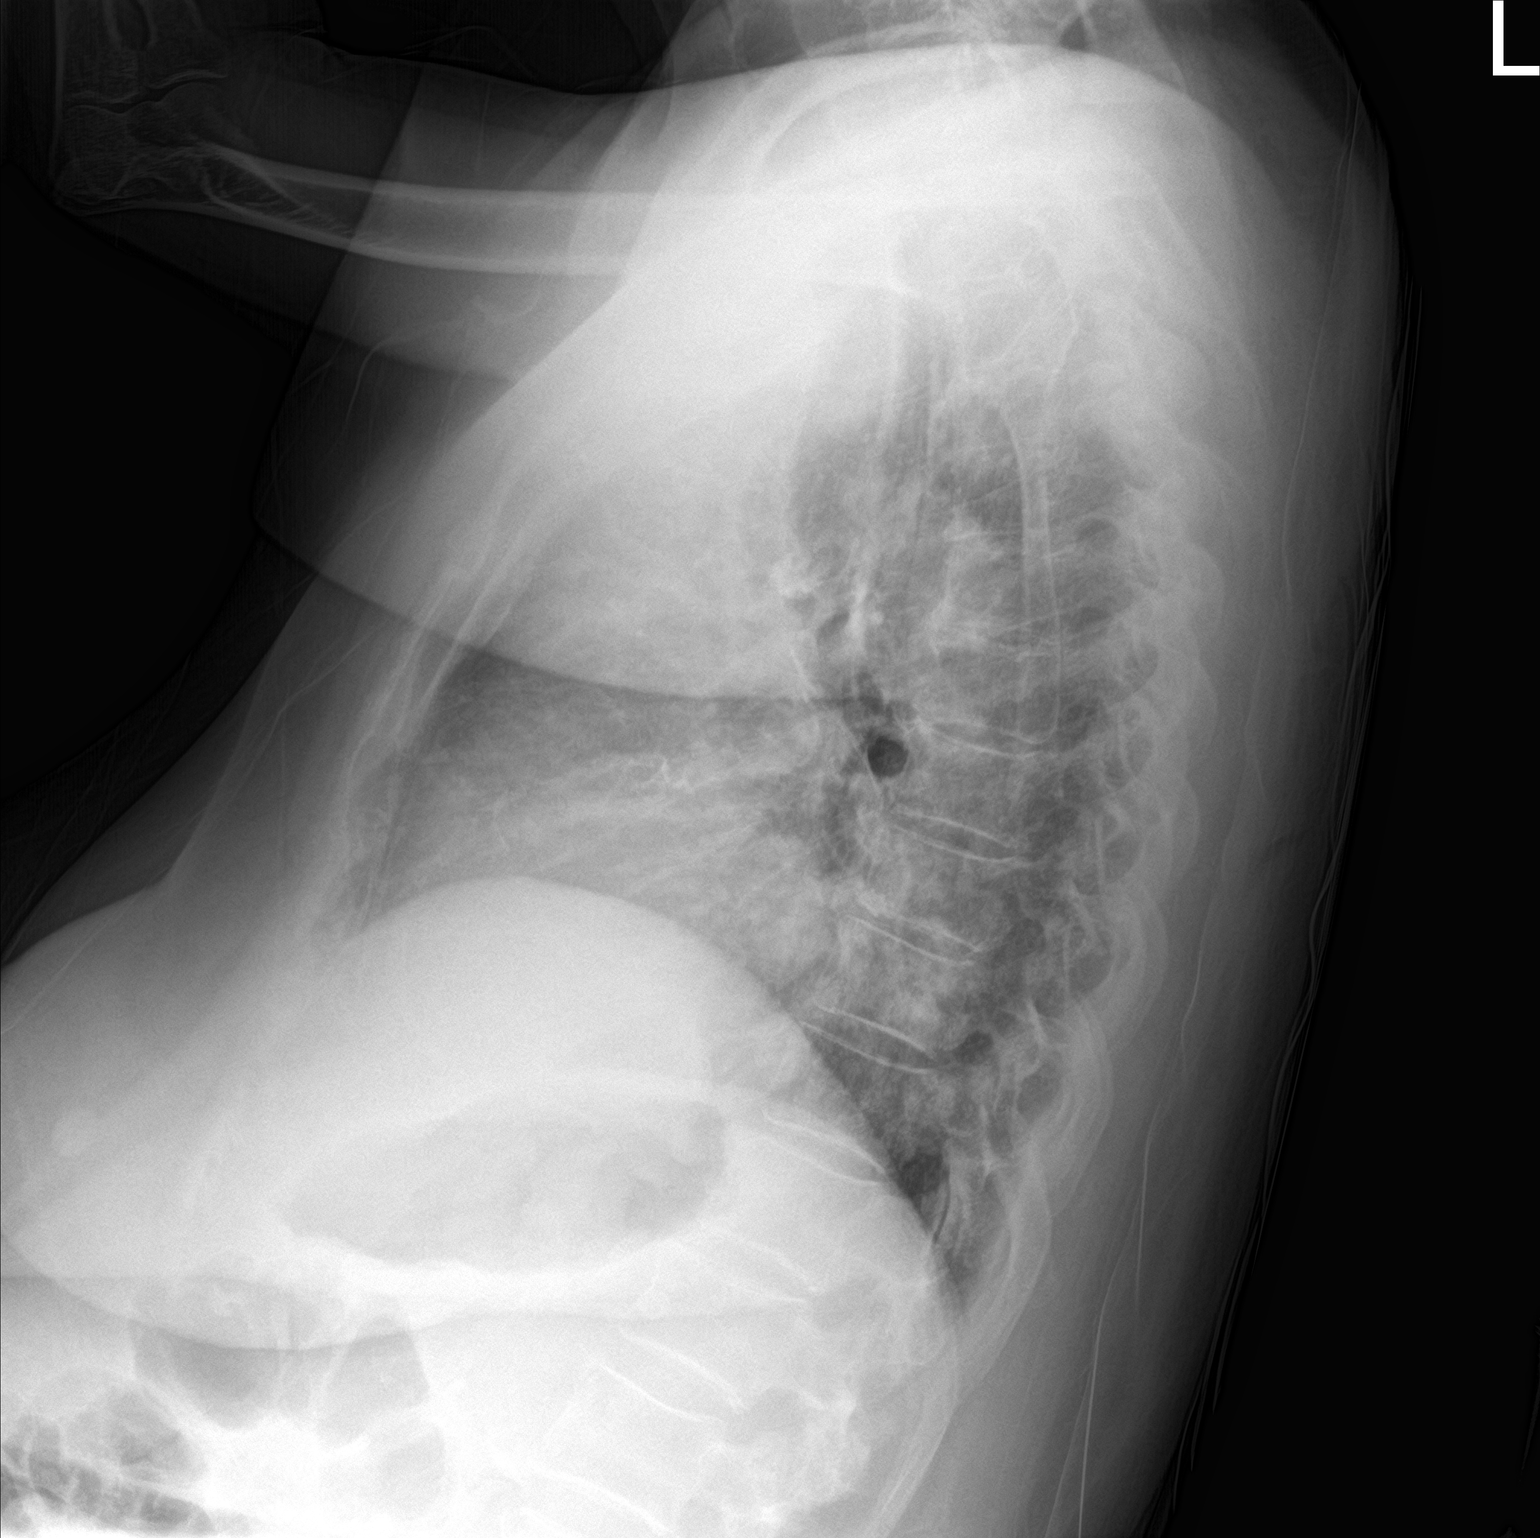

[chest ap strecther]
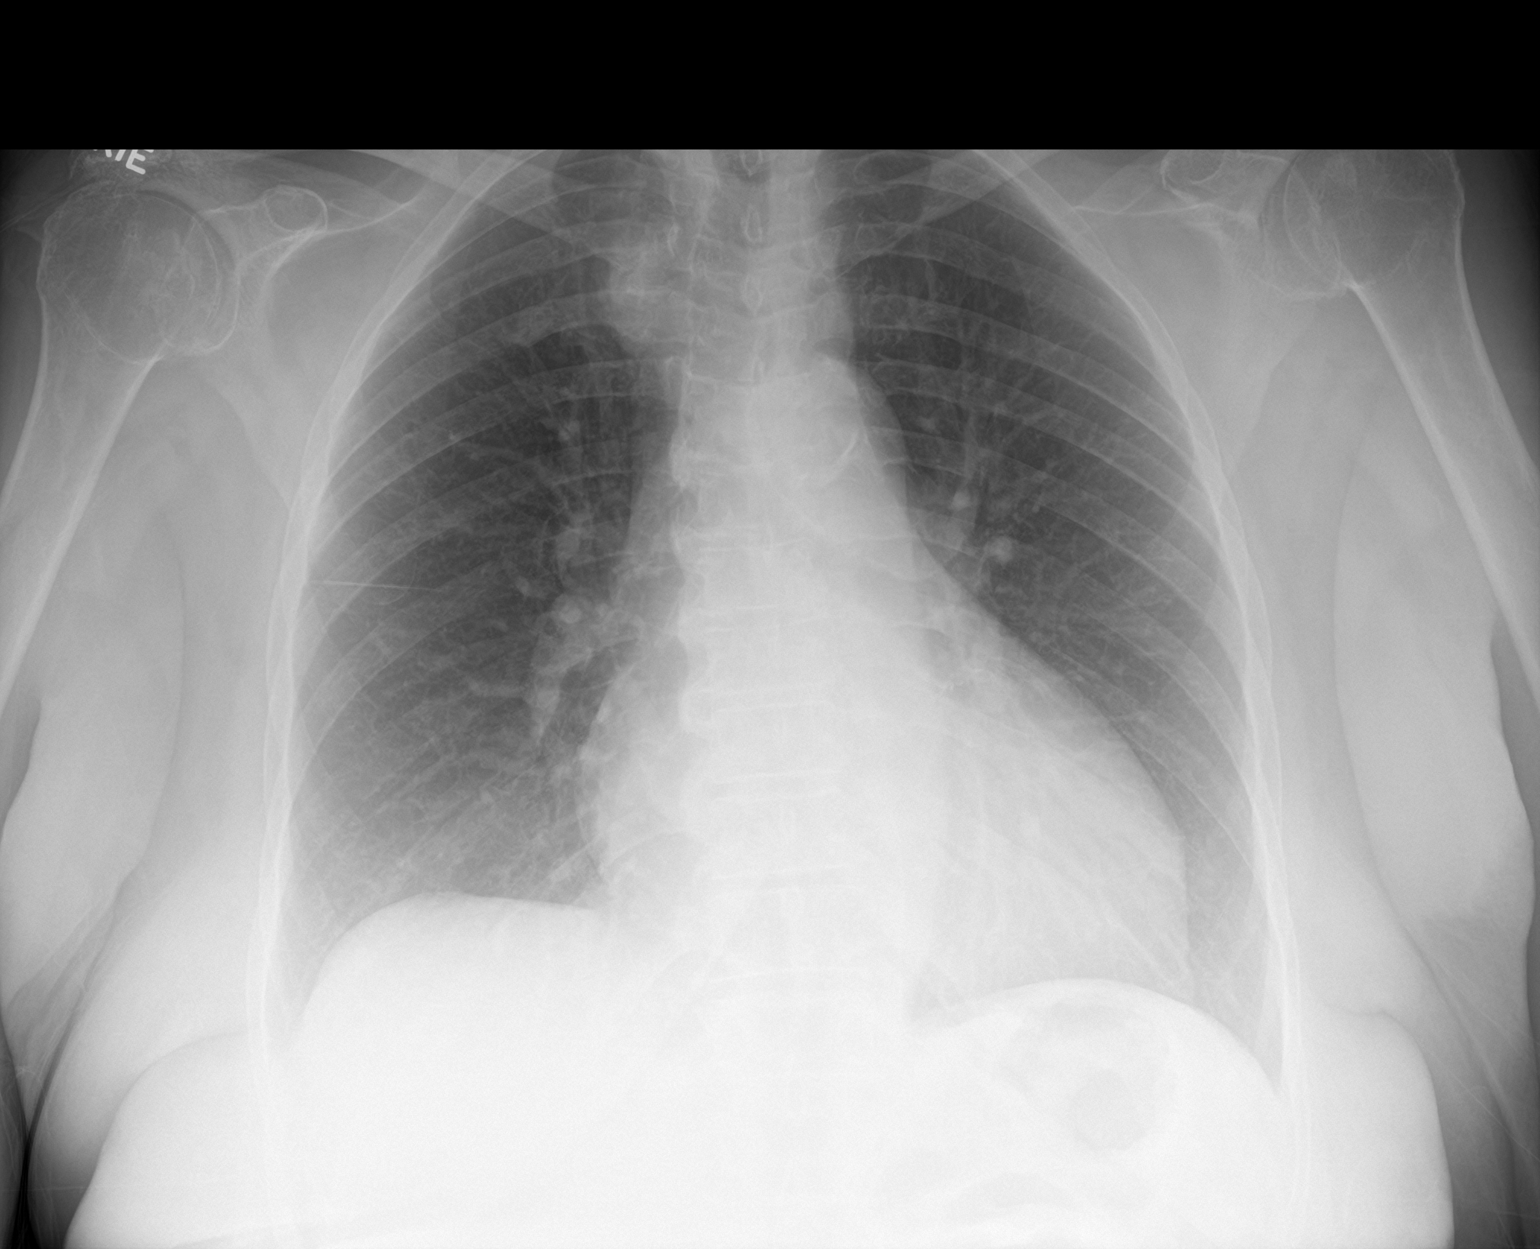

[2 of 2 positions shown; findings below may reference images not displayed]

FINDINGS: There is no focal consolidation. There is no pleural effusion or
pneumothorax. The heart and mediastinal contours are unremarkable.

The osseous structures are unremarkable.
IMPRESSION: No active cardiopulmonary disease.
